# Patient Record
Sex: Female | Born: 1975 | Race: Black or African American | Hispanic: No | Marital: Married | Smoking: Former smoker
Health system: Southern US, Community
[De-identification: ages and names within clinical notes are randomized; demographics above are authoritative.]

## PROBLEM LIST (undated history)

## (undated) DIAGNOSIS — Z87442 Personal history of urinary calculi: Secondary | ICD-10-CM

## (undated) DIAGNOSIS — Z8709 Personal history of other diseases of the respiratory system: Secondary | ICD-10-CM

## (undated) DIAGNOSIS — J45909 Unspecified asthma, uncomplicated: Secondary | ICD-10-CM

## (undated) DIAGNOSIS — S60459A Superficial foreign body of unspecified finger, initial encounter: Secondary | ICD-10-CM

## (undated) HISTORY — PX: TUBAL LIGATION: SHX77

## (undated) HISTORY — PX: FOREIGN BODY REMOVAL: SHX962

## (undated) HISTORY — DX: Unspecified asthma, uncomplicated: J45.909

---

## 2013-06-18 ENCOUNTER — Emergency Department: Payer: Self-pay | Admitting: Emergency Medicine

## 2013-09-18 ENCOUNTER — Emergency Department: Payer: Self-pay | Admitting: Emergency Medicine

## 2014-07-18 DIAGNOSIS — S60459A Superficial foreign body of unspecified finger, initial encounter: Secondary | ICD-10-CM

## 2014-07-18 HISTORY — DX: Superficial foreign body of unspecified finger, initial encounter: S60.459A

## 2014-07-27 ENCOUNTER — Other Ambulatory Visit: Payer: Self-pay | Admitting: Orthopedic Surgery

## 2014-08-02 ENCOUNTER — Encounter (HOSPITAL_BASED_OUTPATIENT_CLINIC_OR_DEPARTMENT_OTHER): Payer: Self-pay | Admitting: *Deleted

## 2014-08-05 ENCOUNTER — Ambulatory Visit (HOSPITAL_BASED_OUTPATIENT_CLINIC_OR_DEPARTMENT_OTHER): Payer: Worker's Compensation | Admitting: Certified Registered"

## 2014-08-05 ENCOUNTER — Ambulatory Visit (HOSPITAL_BASED_OUTPATIENT_CLINIC_OR_DEPARTMENT_OTHER)
Admission: RE | Admit: 2014-08-05 | Discharge: 2014-08-05 | Disposition: A | Discharge status: Home / Self Care | Payer: Worker's Compensation | Source: Ambulatory Visit | Attending: Orthopedic Surgery | Admitting: Orthopedic Surgery

## 2014-08-05 ENCOUNTER — Encounter (HOSPITAL_BASED_OUTPATIENT_CLINIC_OR_DEPARTMENT_OTHER)
Admission: RE | Disposition: A | Discharge status: Home / Self Care | Payer: Self-pay | Source: Ambulatory Visit | Attending: Orthopedic Surgery

## 2014-08-05 ENCOUNTER — Encounter (HOSPITAL_BASED_OUTPATIENT_CLINIC_OR_DEPARTMENT_OTHER): Payer: Self-pay | Admitting: *Deleted

## 2014-08-05 DIAGNOSIS — Y929 Unspecified place or not applicable: Secondary | ICD-10-CM | POA: Diagnosis not present

## 2014-08-05 DIAGNOSIS — Z91018 Allergy to other foods: Secondary | ICD-10-CM | POA: Insufficient documentation

## 2014-08-05 DIAGNOSIS — F17210 Nicotine dependence, cigarettes, uncomplicated: Secondary | ICD-10-CM | POA: Diagnosis not present

## 2014-08-05 DIAGNOSIS — X58XXXA Exposure to other specified factors, initial encounter: Secondary | ICD-10-CM | POA: Insufficient documentation

## 2014-08-05 DIAGNOSIS — J45909 Unspecified asthma, uncomplicated: Secondary | ICD-10-CM | POA: Insufficient documentation

## 2014-08-05 DIAGNOSIS — Z87442 Personal history of urinary calculi: Secondary | ICD-10-CM | POA: Insufficient documentation

## 2014-08-05 DIAGNOSIS — Z91010 Allergy to peanuts: Secondary | ICD-10-CM | POA: Insufficient documentation

## 2014-08-05 DIAGNOSIS — Z91013 Allergy to seafood: Secondary | ICD-10-CM | POA: Insufficient documentation

## 2014-08-05 DIAGNOSIS — S60453A Superficial foreign body of left middle finger, initial encounter: Secondary | ICD-10-CM | POA: Diagnosis present

## 2014-08-05 HISTORY — PX: FOREIGN BODY REMOVAL: SHX962

## 2014-08-05 HISTORY — DX: Superficial foreign body of unspecified finger, initial encounter: S60.459A

## 2014-08-05 HISTORY — DX: Personal history of other diseases of the respiratory system: Z87.09

## 2014-08-05 HISTORY — DX: Personal history of urinary calculi: Z87.442

## 2014-08-05 LAB — POCT HEMOGLOBIN-HEMACUE: Hemoglobin: 12.5 g/dL (ref 12.0–15.0)

## 2014-08-05 SURGERY — REMOVAL FOREIGN BODY EXTREMITY
Anesthesia: General | Site: Finger | Laterality: Left

## 2014-08-05 MED ORDER — FENTANYL CITRATE 0.05 MG/ML IJ SOLN: 50.0000 ug | INTRAMUSCULAR | Status: DC | PRN

## 2014-08-05 MED ORDER — PROPOFOL 10 MG/ML IV BOLUS
INTRAVENOUS | Status: DC | PRN
  Administered 2014-08-05: 160 mg via INTRAVENOUS

## 2014-08-05 MED ORDER — ONDANSETRON HCL 4 MG/2ML IJ SOLN
INTRAMUSCULAR | Status: DC | PRN
  Administered 2014-08-05: 4 mg via INTRAVENOUS

## 2014-08-05 MED ORDER — CEFAZOLIN SODIUM-DEXTROSE 2-3 GM-% IV SOLR: 2.0000 g | INTRAVENOUS | Status: DC

## 2014-08-05 MED ORDER — DEXAMETHASONE SODIUM PHOSPHATE 10 MG/ML IJ SOLN
INTRAMUSCULAR | Status: DC | PRN
  Administered 2014-08-05: 10 mg via INTRAVENOUS

## 2014-08-05 MED ORDER — HYDROCODONE-ACETAMINOPHEN 5-325 MG PO TABS: 1.0000 | ORAL_TABLET | Freq: Once | ORAL | Status: DC | PRN

## 2014-08-05 MED ORDER — FENTANYL CITRATE 0.05 MG/ML IJ SOLN
INTRAMUSCULAR | Status: DC | PRN
  Administered 2014-08-05 (×2): 50 ug via INTRAVENOUS

## 2014-08-05 MED ORDER — HYDROMORPHONE HCL 1 MG/ML IJ SOLN
INTRAMUSCULAR | Status: AC
  Filled 2014-08-05: qty 1

## 2014-08-05 MED ORDER — LIDOCAINE HCL (CARDIAC) 20 MG/ML IV SOLN
INTRAVENOUS | Status: DC | PRN
  Administered 2014-08-05: 40 mg via INTRAVENOUS

## 2014-08-05 MED ORDER — CEFAZOLIN SODIUM-DEXTROSE 2-3 GM-% IV SOLR
INTRAVENOUS | Status: DC | PRN
  Administered 2014-08-05: 2 g via INTRAVENOUS

## 2014-08-05 MED ORDER — MIDAZOLAM HCL 2 MG/2ML IJ SOLN: 1.0000 mg | INTRAMUSCULAR | Status: DC | PRN

## 2014-08-05 MED ORDER — HYDROCODONE-ACETAMINOPHEN 5-325 MG PO TABS: 1.0000 | ORAL_TABLET | Freq: Four times a day (QID) | ORAL | Status: DC | PRN

## 2014-08-05 MED ORDER — MIDAZOLAM HCL 2 MG/ML PO SYRP: 12.0000 mg | ORAL_SOLUTION | Freq: Once | ORAL | Status: DC | PRN

## 2014-08-05 MED ORDER — CHLORHEXIDINE GLUCONATE 4 % EX LIQD: 60.0000 mL | Freq: Once | CUTANEOUS | Status: DC

## 2014-08-05 MED ORDER — HYDROCODONE-ACETAMINOPHEN 5-325 MG PO TABS
ORAL_TABLET | ORAL | Status: AC
  Filled 2014-08-05: qty 1

## 2014-08-05 MED ORDER — FENTANYL CITRATE 0.05 MG/ML IJ SOLN
INTRAMUSCULAR | Status: AC
  Filled 2014-08-05: qty 4

## 2014-08-05 MED ORDER — LACTATED RINGERS IV SOLN
INTRAVENOUS | Status: DC
  Administered 2014-08-05 (×2): via INTRAVENOUS

## 2014-08-05 MED ORDER — MIDAZOLAM HCL 5 MG/5ML IJ SOLN
INTRAMUSCULAR | Status: DC | PRN
  Administered 2014-08-05: 2 mg via INTRAVENOUS

## 2014-08-05 MED ORDER — OXYCODONE HCL 5 MG PO TABS: 5.0000 mg | ORAL_TABLET | Freq: Once | ORAL | Status: DC | PRN

## 2014-08-05 MED ORDER — BUPIVACAINE HCL (PF) 0.25 % IJ SOLN
INTRAMUSCULAR | Status: AC
  Filled 2014-08-05: qty 30

## 2014-08-05 MED ORDER — ONDANSETRON HCL 4 MG/2ML IJ SOLN: 4.0000 mg | Freq: Once | INTRAMUSCULAR | Status: DC | PRN

## 2014-08-05 MED ORDER — 0.9 % SODIUM CHLORIDE (POUR BTL) OPTIME
TOPICAL | Status: DC | PRN
  Administered 2014-08-05: 200 mL

## 2014-08-05 MED ORDER — MIDAZOLAM HCL 2 MG/2ML IJ SOLN
INTRAMUSCULAR | Status: AC
  Filled 2014-08-05: qty 2

## 2014-08-05 MED ORDER — HYDROMORPHONE HCL 1 MG/ML IJ SOLN
0.2500 mg | INTRAMUSCULAR | Status: DC | PRN
  Administered 2014-08-05 (×2): 0.5 mg via INTRAVENOUS

## 2014-08-05 MED ORDER — OXYCODONE HCL 5 MG/5ML PO SOLN: 5.0000 mg | Freq: Once | ORAL | Status: DC | PRN

## 2014-08-05 SURGICAL SUPPLY — 48 items
BLADE MINI RND TIP GREEN BEAV (BLADE) IMPLANT
BLADE SURG 15 STRL LF DISP TIS (BLADE) ×1 IMPLANT
BLADE SURG 15 STRL SS (BLADE) ×2
BNDG COHESIVE 1X5 TAN STRL LF (GAUZE/BANDAGES/DRESSINGS) ×3 IMPLANT
BNDG COHESIVE 3X5 TAN STRL LF (GAUZE/BANDAGES/DRESSINGS) ×3 IMPLANT
BNDG ESMARK 4X9 LF (GAUZE/BANDAGES/DRESSINGS) IMPLANT
BNDG GAUZE ELAST 4 BULKY (GAUZE/BANDAGES/DRESSINGS) IMPLANT
CHLORAPREP W/TINT 26ML (MISCELLANEOUS) ×3 IMPLANT
CORDS BIPOLAR (ELECTRODE) ×3 IMPLANT
COVER BACK TABLE 60X90IN (DRAPES) ×3 IMPLANT
COVER MAYO STAND STRL (DRAPES) ×3 IMPLANT
CUFF TOURNIQUET SINGLE 18IN (TOURNIQUET CUFF) ×3 IMPLANT
DECANTER SPIKE VIAL GLASS SM (MISCELLANEOUS) ×3 IMPLANT
DRAPE EXTREMITY T 121X128X90 (DRAPE) ×3 IMPLANT
DRAPE OEC MINIVIEW 54X84 (DRAPES) ×3 IMPLANT
DRAPE SURG 17X23 STRL (DRAPES) ×3 IMPLANT
DRSG KUZMA FLUFF (GAUZE/BANDAGES/DRESSINGS) IMPLANT
GAUZE SPONGE 4X4 12PLY STRL (GAUZE/BANDAGES/DRESSINGS) ×3 IMPLANT
GAUZE XEROFORM 1X8 LF (GAUZE/BANDAGES/DRESSINGS) ×3 IMPLANT
GLOVE BIO SURGEON STRL SZ7.5 (GLOVE) ×3 IMPLANT
GLOVE BIOGEL M STRL SZ7.5 (GLOVE) ×6 IMPLANT
GLOVE BIOGEL PI IND STRL 8 (GLOVE) ×1 IMPLANT
GLOVE BIOGEL PI IND STRL 8.5 (GLOVE) ×1 IMPLANT
GLOVE BIOGEL PI INDICATOR 8 (GLOVE) ×2
GLOVE BIOGEL PI INDICATOR 8.5 (GLOVE) ×2
GLOVE SURG ORTHO 8.0 STRL STRW (GLOVE) ×3 IMPLANT
GOWN STRL REUS W/ TWL LRG LVL3 (GOWN DISPOSABLE) IMPLANT
GOWN STRL REUS W/ TWL XL LVL3 (GOWN DISPOSABLE) ×1 IMPLANT
GOWN STRL REUS W/TWL LRG LVL3 (GOWN DISPOSABLE)
GOWN STRL REUS W/TWL XL LVL3 (GOWN DISPOSABLE) ×8 IMPLANT
NEEDLE HYPO 22GX1.5 SAFETY (NEEDLE) IMPLANT
NS IRRIG 1000ML POUR BTL (IV SOLUTION) ×3 IMPLANT
PACK BASIN DAY SURGERY FS (CUSTOM PROCEDURE TRAY) ×3 IMPLANT
PAD CAST 3X4 CTTN HI CHSV (CAST SUPPLIES) ×1 IMPLANT
PADDING CAST ABS 3INX4YD NS (CAST SUPPLIES)
PADDING CAST ABS 4INX4YD NS (CAST SUPPLIES)
PADDING CAST ABS COTTON 3X4 (CAST SUPPLIES) IMPLANT
PADDING CAST ABS COTTON 4X4 ST (CAST SUPPLIES) IMPLANT
PADDING CAST COTTON 3X4 STRL (CAST SUPPLIES) ×2
SPLINT PLASTER CAST XFAST 3X15 (CAST SUPPLIES) IMPLANT
SPLINT PLASTER XTRA FASTSET 3X (CAST SUPPLIES)
STOCKINETTE 4X48 STRL (DRAPES) ×3 IMPLANT
SUT ETHILON 5 0 P 3 18 (SUTURE)
SUT ETHILON 5 0 PS 2 18 (SUTURE) IMPLANT
SUT NYLON ETHILON 5-0 P-3 1X18 (SUTURE) IMPLANT
SYR CONTROL 10ML LL (SYRINGE) ×3 IMPLANT
TOWEL OR 17X24 6PK STRL BLUE (TOWEL DISPOSABLE) ×3 IMPLANT
UNDERPAD 30X30 INCONTINENT (UNDERPADS AND DIAPERS) ×3 IMPLANT

## 2014-08-05 NOTE — Anesthesia Procedure Notes (Signed)
Procedure Name: LMA Insertion Date/Time: 08/05/2014 3:12 PM Performed by: Maryella Shivers Pre-anesthesia Checklist: Patient identified, Emergency Drugs available, Suction available and Patient being monitored Patient Re-evaluated:Patient Re-evaluated prior to inductionOxygen Delivery Method: Circle System Utilized Preoxygenation: Pre-oxygenation with 100% oxygen Intubation Type: IV induction Ventilation: Mask ventilation without difficulty LMA: LMA inserted LMA Size: 4.0 Number of attempts: 1 Airway Equipment and Method: bite block Placement Confirmation: positive ETCO2 Tube secured with: Tape Dental Injury: Teeth and Oropharynx as per pre-operative assessment

## 2014-08-05 NOTE — Discharge Instructions (Addendum)

## 2014-08-05 NOTE — Brief Op Note (Signed)
08/05/2014  3:58 PM  PATIENT:  Joy Lewis  38 y.o. female  PRE-OPERATIVE DIAGNOSIS:  FOREIGN BODY LEFT MIDDLE FINGER  POST-OPERATIVE DIAGNOSIS:  FOREIGN BODY LEFT MIDDLE FINGER  PROCEDURE:  Procedure(s): REMOVAL FOREIGN BODY LEFT MIDDLE FINGER (Left)  SURGEON:  Surgeon(s) and Role:    * Daryll Brod, MD - Primary  PHYSICIAN ASSISTANT:   ASSISTANTS: R Dasnoit,PAC   ANESTHESIA:   general  EBL:  Total I/O In: 1000 [I.V.:1000] Out: -   BLOOD ADMINISTERED:none  DRAINS: none   LOCAL MEDICATIONS USED:  NONE  SPECIMEN:  No Specimen  DISPOSITION OF SPECIMEN:  N/A  COUNTS:  YES  TOURNIQUET:   Total Tourniquet Time Documented: Upper Arm (Left) - 26 minutes Total: Upper Arm (Left) - 26 minutes   DICTATION: .Other Dictation: Dictation Number [id number]  PLAN OF CARE: Discharge to home after PACU  PATIENT DISPOSITION:  PACU - hemodynamically stable.

## 2014-08-05 NOTE — Op Note (Signed)
Dictation Number [id number]

## 2014-08-05 NOTE — Op Note (Signed)
NAMEOlevia Lewis NO.:  [id number]  MEDICAL RECORD NO.:  [id number]  LOCATION:                                 FACILITY:  PHYSICIAN:  Daryll Brod, M.D.            DATE OF BIRTH:  DATE OF PROCEDURE:  08/05/2014 DATE OF DISCHARGE:                              OPERATIVE REPORT   PREOPERATIVE DIAGNOSIS:  Foreign body, left middle finger.  POSTOPERATIVE DIAGNOSIS:  Foreign body, left middle finger.  OPERATION:  Removal of deep foreign body, left middle finger.  SURGEON:  Daryll Brod, M.D.  ASSISTANT:  Julian Reil, PA-C  ANESTHESIA:  General.  ANESTHESIOLOGIST:  Glynda Jaeger, M.D.  HISTORY:  The patient is a 38 year old female who suffered a foreign body of her anterior left middle finger.  This has been partially removed through the ten piece.  This has been causing pain discomfort for her.  She is desirous having this removed.  She is well aware of risks and complications including infection; recurrence of injury to arteries, nerves, tendons; incomplete release of symptoms and dystrophy. In the preoperative area, the patient was seen, the extremity marked by both the patient and surgeon, and antibiotic given.  PROCEDURE IN DETAIL:  The patient was brought to the operating room where a general anesthetic was carried out without difficulty.  She was prepped using ChloraPrep, supine position with the left arm free.  A 3- minute dry time was allowed, time-out taken, confirming the patient and procedure.  The area was confirmed on image intensification using a needle to position over the foreign body.  An incision was made obliquely in nature, carried down through the subcutaneous tissue. Neurovascular structures were identified.  The foreign body was found to be lodged in the flexor sheath along the radial margin of the middle phalanx.  This was done with some difficulty using image intensification guidance to ultimately deep this foreign body.   This was encased in granulation tissue, which was removed.  The wound was copiously irrigated with saline.  The foreign body was given to the patient.  The patient tolerated the procedure well.  The wound closed with interrupted 4-0 Vicryl Rapide sutures.  Sterile compressive dressing applied.  On deflation of the tourniquet, all fingers were immediately pinked.  She was taken to the recovery room for observation in satisfactory condition.  She will be discharged to home to return to the [facility] in 1 week, on Vicodin.          ______________________________ Daryll Brod, M.D.     GK/MEDQ  D:  08/05/2014  T:  08/05/2014  Job:  [id number]

## 2014-08-05 NOTE — Transfer of Care (Signed)
Immediate Anesthesia Transfer of Care Note  Patient: Joy Lewis  Procedure(s) Performed: Procedure(s): REMOVAL FOREIGN BODY LEFT MIDDLE FINGER (Left)  Patient Location: PACU  Anesthesia Type:General  Level of Consciousness: sedated  Airway & Oxygen Therapy: Patient Spontanous Breathing and Patient connected to face mask oxygen  Post-op Assessment: Report given to PACU RN and Post -op Vital signs reviewed and stable  Post vital signs: Reviewed and stable  Complications: No apparent anesthesia complications

## 2014-08-05 NOTE — Anesthesia Postprocedure Evaluation (Signed)
  Anesthesia Post-op Note  Patient: Joy Lewis  Procedure(s) Performed: Procedure(s) with comments: REMOVAL FOREIGN BODY LEFT MIDDLE FINGER (Left) - FOREIGN BODY GIVEN TO PATIENT PER DR. Fredna Dow ORDER.  Patient Location: PACU  Anesthesia Type:General  Level of Consciousness: awake, alert  and oriented  Airway and Oxygen Therapy: Patient Spontanous Breathing  Post-op Pain: mild  Post-op Assessment: Post-op Vital signs reviewed, Patient's Cardiovascular Status Stable, Respiratory Function Stable, Patent Airway, No signs of Nausea or vomiting and Pain level controlled  Post-op Vital Signs: stable  Last Vitals:  Filed Vitals:   08/05/14 1615  BP: 126/62  Pulse: 73  Temp:   Resp: 17    Complications: No apparent anesthesia complications

## 2014-08-05 NOTE — Anesthesia Preprocedure Evaluation (Signed)
Anesthesia Evaluation  Patient identified by MRN, date of birth, ID band Patient awake    Reviewed: Allergy & Precautions, H&P , NPO status , Patient's Chart, lab work & pertinent test results  Airway Mallampati: I  TM Distance: >3 FB Neck ROM: Full    Dental  (+) Teeth Intact, Dental Advisory Given   Pulmonary Current Smoker,  breath sounds clear to auscultation        Cardiovascular Rhythm:Regular Rate:Normal     Neuro/Psych    GI/Hepatic   Endo/Other    Renal/GU      Musculoskeletal   Abdominal   Peds  Hematology   Anesthesia Other Findings   Reproductive/Obstetrics                             Anesthesia Physical Anesthesia Plan  ASA: II  Anesthesia Plan: General   Post-op Pain Management:    Induction: Intravenous  Airway Management Planned: LMA  Additional Equipment:   Intra-op Plan:   Post-operative Plan:   Informed Consent: I have reviewed the patients History and Physical, chart, labs and discussed the procedure including the risks, benefits and alternatives for the proposed anesthesia with the patient or authorized representative who has indicated his/her understanding and acceptance.   Dental advisory given  Plan Discussed with: CRNA and Anesthesiologist  Anesthesia Plan Comments: (FB LMF [place]  Plan GA with LMA  Roberts Gaudy)        Anesthesia Quick Evaluation

## 2014-08-05 NOTE — H&P (Signed)
Joy Lewis is a 38 year old right hand dominant female who comes in complaining of having an injury to her left middle finger when an embroidery needle went through her left middle finger at the middle phalanx level. The injury occurred on 12-09-12. This was in [place], [place]. She states that little was done for 2 months. She was seen at the hospital where attempt at removal was performed. This was unsuccessful. She subsequently had surgery in May 2014 apparently by Dr. Doris Cheadle who removed it. Apparently a portion was left. She was out of work for one week and then returned. She states that she did not have any therapy following this. She continues to complain of pain. She has no prior history of injury. No history of diabetes, thyroid problems, arthritis or gout. She has not settled her case. She states she got stuck with the needle working at an [employer] came down on her with it having been delayed and punched the middle finger leaving a piece of metal there. She complains of intermittent, moderate, sharp, stabbing, throbbing and aching type pain with a feeling of numbness and weakness over the radial aspect left middle finger. She is complaining of some pain going up her hand both dorsally and palmarly. She has been taking ibuprofen occasionally for discomfort.  PAST MEDICAL HISTORY: She has no known drug allergies. She takes no medicines. She has had a tubal ligation and surgery on her hand.  FAMILY H ISTORY: Positive for diabetes, arthritis and high BP.  SOCIAL HISTORY: She does not smoke. She drinks occasionally. 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  REVIEW OF SYSTEMS: Positive for asthma, nervousness, otherwise negative for 14 points. Joy Lewis is an 38 y.o. female.   Chief Complaint: foreign body left middle finger HPI: see above  Past Medical History  Diagnosis Date  . Foreign body of finger of left hand 07/2014    left middle finger  .  History of kidney stones   . History of asthma     as a child    Past Surgical History  Procedure Laterality Date  . Tubal ligation    . Foreign body removal Left     middle finger    History reviewed. No pertinent family history. Social History:  reports that she has been smoking Cigarettes.  She has been smoking about 0.00 packs per day for the past 13 years. She has never used smokeless tobacco. She reports that she drinks alcohol. She reports that she does not use illicit drugs.  Allergies:  Allergies  Allergen Reactions  . Peanut-Containing Drug Products Hives and Swelling  . Shellfish Allergy Hives and Swelling  . Tomato Hives and Swelling    Medications Prior to Admission  Medication Sig Dispense Refill  . ibuprofen (ADVIL,MOTRIN) 200 MG tablet Take 200 mg by mouth every 6 (six) hours as needed.      No results found for this or any previous visit (from the past 48 hour(s)).  No results found.   Pertinent items are noted in HPI.  Blood pressure 106/76, pulse 64, temperature 98.8 F (37.1 C), temperature source Oral, resp. rate 18, height 5' (1.524 m), weight 58.627 kg (129 lb 4 oz), last menstrual period 08/02/2014, SpO2 100 %.  General appearance: alert, cooperative and appears stated age Head: Normocephalic, without obvious abnormality Neck: no JVD Resp: clear to auscultation bilaterally Cardio: regular rate and rhythm, S1, S2 normal, no murmur, click, rub or gallop  GI: soft, non-tender; bowel sounds normal; no masses,  no organomegaly Extremities: foreign body left middle finger Pulses: 2+ and symmetric Skin: Skin color, texture, turgor normal. No rashes or lesions Neurologic: Grossly normal Incision/Wound: na  Assessment/Plan X-rays reveal there is a small fragment left. It is difficult to determine exactly where this is on the radial aspect mid portion of the middle phalanx slightly volar.  She has had her MRI done and this reveals that the entire area  was wiped out by the foreign body being unable to tell exactly where it was but it is present. Flexion/extension reveals that it does not move with the tendons, as such it is likely in the soft tissues directly adjacent to it in the flexor sheath. She would like to have this removed. We have discussed the possibility of excision of the foreign body from her left middle finger. Pre, peri and post op care are discussed along with risks and complications. Patient is aware there is no guarantee with surgery, possibility of infection, injury to arteries, nerves, and tendons, incomplete relief and dystrophy. She is scheduled for excision foreign body left middle finger as an outpatient under regional anesthesia.  Joy Lewis R 08/05/2014, 12:58 PM

## 2014-08-06 ENCOUNTER — Encounter (HOSPITAL_BASED_OUTPATIENT_CLINIC_OR_DEPARTMENT_OTHER): Payer: Self-pay | Admitting: Orthopedic Surgery

## 2015-08-10 ENCOUNTER — Emergency Department
Admission: EM | Admit: 2015-08-10 | Discharge: 2015-08-10 | Disposition: A | Discharge status: Home / Self Care | Payer: Self-pay | Attending: Emergency Medicine | Admitting: Emergency Medicine

## 2015-08-10 DIAGNOSIS — K122 Cellulitis and abscess of mouth: Secondary | ICD-10-CM | POA: Insufficient documentation

## 2015-08-10 DIAGNOSIS — M272 Inflammatory conditions of jaws: Secondary | ICD-10-CM

## 2015-08-10 DIAGNOSIS — F17210 Nicotine dependence, cigarettes, uncomplicated: Secondary | ICD-10-CM | POA: Insufficient documentation

## 2015-08-10 MED ORDER — AMOXICILLIN-POT CLAVULANATE 875-125 MG PO TABS: 1.0000 | ORAL_TABLET | Freq: Two times a day (BID) | ORAL | Status: DC

## 2015-08-10 MED ORDER — MAGIC MOUTHWASH W/LIDOCAINE: 5.0000 mL | Freq: Four times a day (QID) | ORAL | Status: DC

## 2015-08-10 MED ORDER — LIDOCAINE-EPINEPHRINE (PF) 1 %-1:200000 IJ SOLN
INTRAMUSCULAR | Status: AC
  Filled 2015-08-10: qty 30

## 2015-08-10 MED ORDER — OXYCODONE-ACETAMINOPHEN 5-325 MG PO TABS: 1.0000 | ORAL_TABLET | Freq: Four times a day (QID) | ORAL | Status: DC | PRN

## 2015-08-10 NOTE — ED Provider Notes (Signed)
INCISION AND DRAINAGE Performed by: Lenise Arena E Consent: Verbal consent obtained. Risks and benefits: risks, benefits and alternatives were discussed Type: abscess  Body area: Hard palate  Anesthesia: local infiltration  Incision was made with a scalpel.  Local anesthetic: lidocaine 1 % with epinephrine  Anesthetic total: 3 ml  Complexity: Simple Blunt dissection to break up loculations  Drainage: purulent  Drainage amount: 2-3 ML's   Patient tolerance: Patient tolerated the procedure well with no immediate complications. ------------------------------------------------------------------------------------------------------------------------ Patient underwent incision and drainage as noted above, she tolerated this well with no cough medication. Pressure was applied to the referring mouth with gauze. She'll be placed on antibiotics and will have close follow-up.   Medical screening examination/treatment/procedure(s) were performed by non-physician practitioner and as supervising physician I was immediately available for consultation/collaboration.       Earleen Newport, MD 08/10/15 [phone]

## 2015-08-10 NOTE — ED Notes (Signed)
Pt c/o abscess to the roof of the mouth for the past 6 days.

## 2015-08-10 NOTE — ED Provider Notes (Signed)
[facility] Emergency Department Provider Note  ____________________________________________  Time seen: Approximately 6:16 PM  I have reviewed the triage vital signs and the nursing notes.   HISTORY  Chief Complaint Abscess    HPI Joy Lewis is a 39 y.o. female who presents to emergency department with a complaint of a abscess to the roof of her mouth. She states that symptoms began initially a week ago and had increased. She states initially she noticed a swollen and tender area around the dentition in the roof of her mouth. She is tried multiple home remedies including over-the-counter medications as well as her most recent hematocrit control. She reports that the swelling, pain, symptoms have increased progressively. At this point patient is not talking due to pain and swelling. She denies any shortness of breath or difficulty swallowing. She endorses low-grade fevers. Pain is sharp, constant, worse with talking or eating.   Past Medical History  Diagnosis Date  . Foreign body of finger of left hand 07/2014    left middle finger  . History of kidney stones   . History of asthma     as a child    There are no active problems to display for this patient.   Past Surgical History  Procedure Laterality Date  . Tubal ligation    . Foreign body removal Left     middle finger  . Foreign body removal Left 08/05/2014    Procedure: REMOVAL FOREIGN BODY LEFT MIDDLE FINGER;  Surgeon: Daryll Brod, MD;  Location: [facility];  Service: Orthopedics;  Laterality: Left;  FOREIGN BODY GIVEN TO PATIENT PER DR. Fredna Dow ORDER.    Current Outpatient Rx  Name  Route  Sig  Dispense  Refill  . amoxicillin-clavulanate (AUGMENTIN) 875-125 MG tablet   Oral   Take 1 tablet by mouth 2 (two) times daily.   14 tablet   0   . HYDROcodone-acetaminophen (NORCO) 5-325 MG per tablet   Oral   Take 1 tablet by mouth every 6 (six) hours as needed for moderate  pain.   30 tablet   0   . ibuprofen (ADVIL,MOTRIN) 200 MG tablet   Oral   Take 200 mg by mouth every 6 (six) hours as needed.         . magic mouthwash w/lidocaine SOLN   Oral   Take 5 mLs by mouth 4 (four) times daily.   240 mL   0   . oxyCODONE-acetaminophen (ROXICET) 5-325 MG tablet   Oral   Take 1 tablet by mouth every 6 (six) hours as needed for severe pain.   20 tablet   0     Allergies Peanut-containing drug products; Shellfish allergy; and Tomato  No family history on file.  Social History Social History  Substance Use Topics  . Smoking status: Current Some Day Smoker -- 13 years    Types: Cigarettes  . Smokeless tobacco: Never Used     Comment: 3 cig./week  . Alcohol Use: Yes     Comment: occasionally    Review of Systems Constitutional: No fever/chills Eyes: No visual changes. ENT: No sore throat. Endorses abscess to remove mouth. Cardiovascular: Denies chest pain. Respiratory: Denies shortness of breath. Gastrointestinal: No abdominal pain.  No nausea, no vomiting.  No diarrhea.  No constipation. Genitourinary: Negative for dysuria. Musculoskeletal: Negative for back pain. Skin: Negative for rash. Neurological: Negative for headaches, focal weakness or numbness.  10-point ROS otherwise negative.  ____________________________________________   PHYSICAL EXAM:  VITAL SIGNS: ED Triage Vitals  Enc Vitals Group     BP 08/10/15 1808 114/79 mmHg     Pulse Rate 08/10/15 1808 112     Resp 08/10/15 1808 18     Temp 08/10/15 1808 99.8 F (37.7 C)     Temp Source 08/10/15 1808 Oral     SpO2 08/10/15 1808 98 %     Weight 08/10/15 1809 115 lb (52.164 kg)     Height 08/10/15 1809 5\' 3"  (1.6 m)     Head Cir --      Peak Flow --      Pain Score 08/10/15 1809 10     Pain Loc --      Pain Edu? --      Excl. in [place]? --     Constitutional: Alert and oriented. Well appearing and in no acute distress. Eyes: Conjunctivae are normal. PERRL. EOMI. Head:  Atraumatic. Nose: No congestion/rhinnorhea. Mouth/Throat: Mucous membranes are moist.  Oropharynx non-erythematous. Large erythematous and edematous fluctuant lesion noted to the right side hard palate. No drainage noted. Lesion extends from front incisors to back molars. No swelling of the oropharynx at this time. Neck: No stridor.   Hematological/Lymphatic/Immunilogical: No cervical lymphadenopathy. Cardiovascular: Normal rate, regular rhythm. Grossly normal heart sounds.  Good peripheral circulation. Respiratory: Normal respiratory effort.  No retractions. Lungs CTAB. Gastrointestinal: Soft and nontender. No distention. No abdominal bruits. No CVA tenderness. Musculoskeletal: No lower extremity tenderness nor edema.  No joint effusions. Neurologic:  Normal speech and language. No gross focal neurologic deficits are appreciated. No gait instability. Skin:  Skin is warm, dry and intact. No rash noted. Psychiatric: Mood and affect are normal. Speech and behavior are normal.  ____________________________________________   LABS (all labs ordered are listed, but only abnormal results are displayed)  Labs Reviewed - No data to display ____________________________________________  EKG   ____________________________________________  RADIOLOGY   ____________________________________________   PROCEDURES  Procedure(s) performed: Yes, incision and drainage, see procedure note(s).   INCISION AND DRAINAGE Performed by: Charline Bills Cuthriell and Dr. Lenise Arena See separate note by Dr. Jimmye Norman.      Critical Care performed: No  ____________________________________________   INITIAL IMPRESSION / ASSESSMENT AND PLAN / ED COURSE  Pertinent labs & imaging results that were available during my care of the patient were reviewed by me and considered in my medical decision making (see chart for details).  Patient's history, symptoms, physical exam are consistent with a abscess  to the roof of the mouth on the right side. After initial assessment I discussed findings with Dr. Jimmye Norman and he proceeded to evaluate the patient as well. Incision and drainage was performed by Dr. Jimmye Norman with myself assisting. 2-3 ML's of frank pus was expressed. Patient tolerated procedure well. The patient will be sent home with antibiotics and pain medication for same. Advised patient to practice good oral hygiene as well as follow-up with dentist. The patient was reevaluated after incision and drainage with no continued bleeding. Patient verbalizes improvement to symptoms. Patient will be discharged home with strict ED precautions to return for any increase in swelling, fevers, difficulty breathing or swallowing. Patient verbalizes understanding of the treatment plan and verbalizes compliance with same. ____________________________________________   FINAL CLINICAL IMPRESSION(S) / ED DIAGNOSES  Final diagnoses:  Hard palate abscess      Darletta Moll, PA-C 08/10/15 1900  Earleen Newport, MD 08/10/15 2039

## 2015-08-10 NOTE — ED Notes (Signed)
Pt presents with swelling noted to right upper gum and palate. Pt unable to speak. Pt husband reports pt has tried multiple home remedies and has been unable to eat and is having difficulty sleeping due to the pain.

## 2015-08-10 NOTE — Discharge Instructions (Signed)

## 2016-07-23 ENCOUNTER — Emergency Department: Payer: Self-pay

## 2016-07-23 ENCOUNTER — Emergency Department
Admission: EM | Admit: 2016-07-23 | Discharge: 2016-07-23 | Disposition: A | Discharge status: Home / Self Care | Payer: Self-pay | Attending: Emergency Medicine | Admitting: Emergency Medicine

## 2016-07-23 DIAGNOSIS — N23 Unspecified renal colic: Secondary | ICD-10-CM | POA: Insufficient documentation

## 2016-07-23 DIAGNOSIS — J45909 Unspecified asthma, uncomplicated: Secondary | ICD-10-CM | POA: Insufficient documentation

## 2016-07-23 DIAGNOSIS — Z791 Long term (current) use of non-steroidal anti-inflammatories (NSAID): Secondary | ICD-10-CM | POA: Insufficient documentation

## 2016-07-23 DIAGNOSIS — F17210 Nicotine dependence, cigarettes, uncomplicated: Secondary | ICD-10-CM | POA: Insufficient documentation

## 2016-07-23 LAB — URINALYSIS COMPLETE WITH MICROSCOPIC (ARMC ONLY)
BACTERIA UA: NONE SEEN
Bilirubin Urine: NEGATIVE
Glucose, UA: NEGATIVE mg/dL
Ketones, ur: NEGATIVE mg/dL
NITRITE: NEGATIVE
PROTEIN: 30 mg/dL — AB
SPECIFIC GRAVITY, URINE: 1.019 (ref 1.005–1.030)
pH: 8 (ref 5.0–8.0)

## 2016-07-23 LAB — COMPREHENSIVE METABOLIC PANEL
ALT: 13 U/L — ABNORMAL LOW (ref 14–54)
AST: 20 U/L (ref 15–41)
Albumin: 3.9 g/dL (ref 3.5–5.0)
Alkaline Phosphatase: 58 U/L (ref 38–126)
Anion gap: 7 (ref 5–15)
BUN: 14 mg/dL (ref 6–20)
CO2: 25 mmol/L (ref 22–32)
Calcium: 9 mg/dL (ref 8.9–10.3)
Chloride: 106 mmol/L (ref 101–111)
Creatinine, Ser: 0.82 mg/dL (ref 0.44–1.00)
GFR calc Af Amer: >60 mL/min (ref >60)
GFR calc non Af Amer: >60 mL/min (ref >60)
Glucose, Bld: 120 mg/dL — ABNORMAL HIGH (ref 65–99)
Potassium: 4 mmol/L (ref 3.5–5.1)
Sodium: 138 mmol/L (ref 135–145)
Total Bilirubin: 0.1 mg/dL — ABNORMAL LOW (ref 0.3–1.2)
Total Protein: 7.2 g/dL (ref 6.5–8.1)

## 2016-07-23 LAB — CBC
HEMATOCRIT: 39.4 % (ref 35.0–47.0)
HEMOGLOBIN: 13.5 g/dL (ref 12.0–16.0)
MCH: 34.8 pg — AB (ref 26.0–34.0)
MCHC: 34.4 g/dL (ref 32.0–36.0)
MCV: 101.3 fL — ABNORMAL HIGH (ref 80.0–100.0)
Platelets: 239 10*3/uL (ref 150–440)
RBC: 3.89 MIL/uL (ref 3.80–5.20)
RDW: 14.2 % (ref 11.5–14.5)
WBC: 9.6 10*3/uL (ref 3.6–11.0)

## 2016-07-23 LAB — LIPASE, BLOOD: Lipase: 21 U/L (ref 11–51)

## 2016-07-23 MED ORDER — ONDANSETRON 4 MG PO TBDP: 4.0000 mg | ORAL_TABLET | Freq: Three times a day (TID) | ORAL | 0 refills | Status: DC | PRN

## 2016-07-23 MED ORDER — OXYCODONE-ACETAMINOPHEN 7.5-325 MG PO TABS: 1.0000 | ORAL_TABLET | ORAL | 0 refills | Status: AC | PRN

## 2016-07-23 MED ORDER — HYDROMORPHONE HCL 1 MG/ML IJ SOLN
0.5000 mg | Freq: Once | INTRAMUSCULAR | Status: AC
  Administered 2016-07-23: 0.5 mg via INTRAVENOUS

## 2016-07-23 MED ORDER — HYDROMORPHONE HCL 1 MG/ML IJ SOLN
INTRAMUSCULAR | Status: AC
  Administered 2016-07-23: 0.5 mg via INTRAVENOUS
  Filled 2016-07-23: qty 1

## 2016-07-23 MED ORDER — CEPHALEXIN 500 MG PO CAPS: 500.0000 mg | ORAL_CAPSULE | Freq: Four times a day (QID) | ORAL | 0 refills | Status: AC

## 2016-07-23 MED ORDER — ONDANSETRON HCL 4 MG/2ML IJ SOLN
4.0000 mg | Freq: Once | INTRAMUSCULAR | Status: AC | PRN
  Administered 2016-07-23: 4 mg via INTRAVENOUS
  Filled 2016-07-23: qty 2

## 2016-07-23 MED ORDER — TAMSULOSIN HCL 0.4 MG PO CAPS: 0.4000 mg | ORAL_CAPSULE | Freq: Every day | ORAL | 0 refills | Status: DC

## 2016-07-23 MED ORDER — KETOROLAC TROMETHAMINE 30 MG/ML IJ SOLN
30.0000 mg | Freq: Once | INTRAMUSCULAR | Status: AC
  Administered 2016-07-23: 30 mg via INTRAVENOUS
  Filled 2016-07-23: qty 1

## 2016-07-23 NOTE — ED Notes (Signed)
Pt resting in bed, pt denies any needs at this time.

## 2016-07-23 NOTE — ED Provider Notes (Signed)
[facility] Emergency Department Provider Note        Time seen: ----------------------------------------- 8:34 AM on 07/23/2016 -----------------------------------------    I have reviewed the triage vital signs and the nursing notes.   HISTORY  Chief Complaint Abdominal Pain    HPI Glynn Laatsch is a 40 y.o. female who presents to the ER for right lower quadrant pain that radiates around her back with nausea and vomiting since early this morning. Patient reports a history of kidney stones in the past, is not sure when her last one was. She denies fevers or chills, has had some nausea but no vomiting. Patient states the pain is sharp.   Past Medical History:  Diagnosis Date  . Foreign body of finger of left hand 07/2014   left middle finger  . History of asthma    as a child  . History of kidney stones     There are no active problems to display for this patient.   Past Surgical History:  Procedure Laterality Date  . FOREIGN BODY REMOVAL Left    middle finger  . FOREIGN BODY REMOVAL Left 08/05/2014   Procedure: REMOVAL FOREIGN BODY LEFT MIDDLE FINGER;  Surgeon: Daryll Brod, MD;  Location: [place];  Service: Orthopedics;  Laterality: Left;  FOREIGN BODY GIVEN TO PATIENT PER DR. Fredna Dow ORDER.  . TUBAL LIGATION      Allergies Peanut-containing drug products; Shellfish allergy; Tomato; and Penicillins  Social History Social History  Substance Use Topics  . Smoking status: Current Some Day Smoker    Years: 13.00    Types: Cigarettes  . Smokeless tobacco: Never Used     Comment: 3 cig./week  . Alcohol use Yes     Comment: occasionally    Review of Systems Constitutional: Negative for fever. Cardiovascular: Negative for chest pain. Respiratory: Negative for shortness of breath. Gastrointestinal: Positive for abdominal pain, nausea Genitourinary: Negative for dysuria. Musculoskeletal: Negative for back pain. Skin:  Negative for rash. Neurological: Negative for headaches, focal weakness or numbness.  10-point ROS otherwise negative.  ____________________________________________   PHYSICAL EXAM:  VITAL SIGNS: ED Triage Vitals  Enc Vitals Group     BP 07/23/16 0809 (!) 120/49     Pulse Rate 07/23/16 0809 74     Resp 07/23/16 0809 20     Temp 07/23/16 0809 97.9 F (36.6 C)     Temp Source 07/23/16 0809 Oral     SpO2 07/23/16 0809 100 %     Weight 07/23/16 0809 135 lb (61.2 kg)     Height 07/23/16 0809 5' (1.524 m)     Head Circumference --      Peak Flow --      Pain Score 07/23/16 0810 8     Pain Loc --      Pain Edu? --      Excl. in [place]? --     Constitutional: Alert and oriented. Well appearing and in no distress. Eyes: Conjunctivae are normal. PERRL. Normal extraocular movements. ENT   Head: Normocephalic and atraumatic.   Nose: No congestion/rhinnorhea.   Mouth/Throat: Mucous membranes are moist.   Neck: No stridor. Cardiovascular: Normal rate, regular rhythm. No murmurs, rubs, or gallops. Respiratory: Normal respiratory effort without tachypnea nor retractions. Breath sounds are clear and equal bilaterally. No wheezes/rales/rhonchi. Gastrointestinal: Right flank tenderness, no rebound or guarding. Normal bowel sounds. Musculoskeletal: Nontender with normal range of motion in all extremities. No lower extremity tenderness nor edema. Neurologic:  Normal speech  and language. No gross focal neurologic deficits are appreciated.  Skin:  Skin is warm, dry and intact. No rash noted. Psychiatric: Mood and affect are normal. Speech and behavior are normal.  ____________________________________________  EKG: Interpreted by me. Sinus rhythm with a rate of 61 bpm, normal PR interval, normal QRS, normal QT, normal axis.  ____________________________________________  ED COURSE:  Pertinent labs & imaging results that were available during my care of the patient were reviewed by  me and considered in my medical decision making (see chart for details). Clinical Course   Patient presents for flank pain, likely renal colic. We will assess with labs and imaging.  Procedures ____________________________________________   LABS (pertinent positives/negatives)  Labs Reviewed  COMPREHENSIVE METABOLIC PANEL - Abnormal; Notable for the following:       Result Value   Glucose, Bld 120 (*)    ALT 13 (*)    Total Bilirubin 0.1 (*)    All other components within normal limits  CBC - Abnormal; Notable for the following:    MCV 101.3 (*)    MCH 34.8 (*)    All other components within normal limits  LIPASE, BLOOD  URINALYSIS COMPLETEWITH MICROSCOPIC ([facility] ONLY)    RADIOLOGY Images were viewed by me  CT renal protocol IMPRESSION: 1. Bilateral nephrolithiasis with mild to moderate right hydroureteronephrosis secondary to a distal right ureteral stone. Mild fullness left intrarenal collecting system with probable proximal left ureteral stone identified. 2. Small volume intraperitoneal free fluid, may be physiologic in a reproductive age female. 3. Bilateral Petit's hernias, left greater than right, containing only fat. ____________________________________________  FINAL ASSESSMENT AND PLAN  Flank pain, Bilateral Renal colic  Plan: Patient with labs and imaging as dictated above. Patient with flank pain and abdominal pain secondary to bilateral renal colic. I have discussed with urology who will arrange close outpatient follow-up. She'll be discharged with pain medication, Flomax and is advised to return for worsening or worrisome symptoms.   Earleen Newport, MD   Note: This dictation was prepared with Dragon dictation. Any transcriptional errors that result from this process are unintentional    Earleen Newport, MD 07/23/16 1226

## 2016-07-23 NOTE — ED Notes (Signed)
Pt resting in phone, talking on phone, pt awake and alert in no acute distress

## 2016-07-23 NOTE — ED Notes (Signed)
Pt discharged home after verbalizing understanding of discharge instructions; nad noted. 

## 2016-07-23 NOTE — ED Triage Notes (Signed)
Pt comes into the ED via EMS from home with c/o RLQ pain that radiates around to the back with N/V since early this morning.Marland Kitchen

## 2017-03-20 IMAGING — CT CT RENAL STONE PROTOCOL
2 of 4 series · 15 of 46 positions shown, 17 images · IV contrast (eovist)
Comparison: None.

ADDENDUM:
As described in the body of the report, the patient has two
ill-defined hepatic lesions, not suggestive of cysts. These may
represent cavernous hemangioma, but follow-up nonemergent MRI of the
abdomen without and with contrast is recommended to further
evaluate. Use of Eovist contrast material is recommended.

These results will be called to the ordering clinician or
representative by the Radiologist Assistant, and communication
documented in the PACS or zVision Dashboard.
CLINICAL DATA: Right lower quadrant pain radiates to the back with
nausea and vomiting since 4 a.m. this morning.
EXAM:
CT ABDOMEN AND PELVIS WITHOUT CONTRAST
TECHNIQUE: Multidetector CT imaging of the abdomen and pelvis was performed
following the standard protocol without IV contrast.

[Series 2: axial st · axial · 0.59mm/px · z∈[-1195,-845]mm · 12 of 81 slices shown, 14 images]
[im 7/81  soft-tissue]
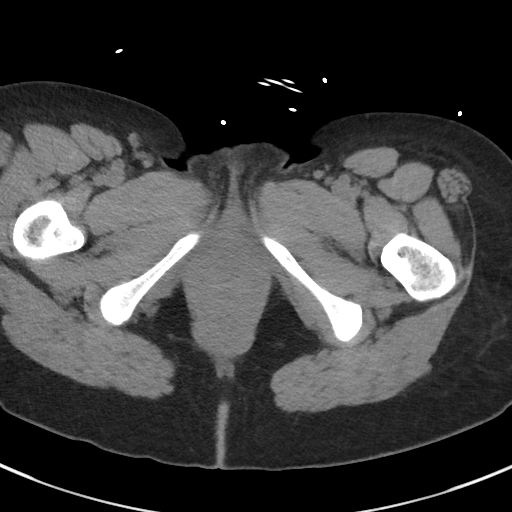
[im 7/81  bone]
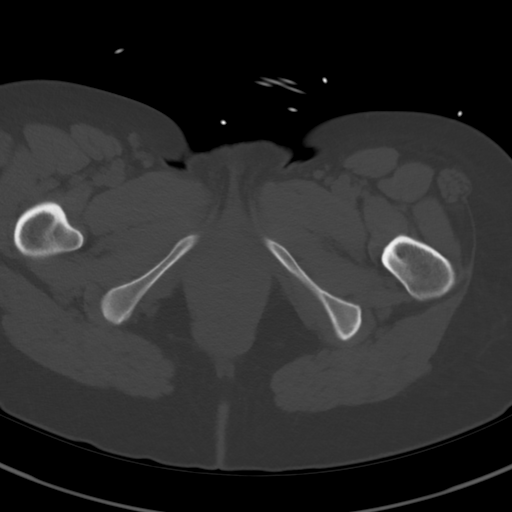
[im 13/81  soft-tissue]
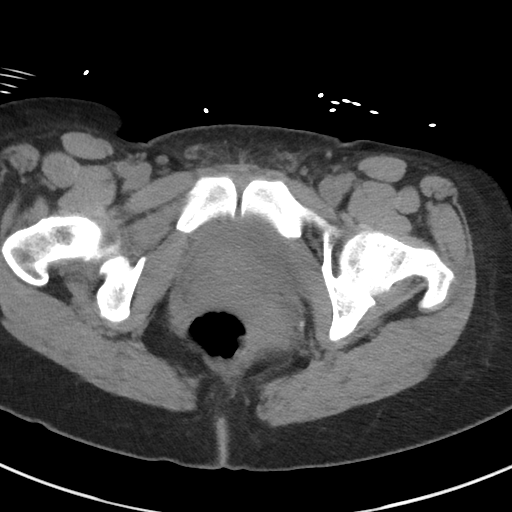
[im 20/81  soft-tissue]
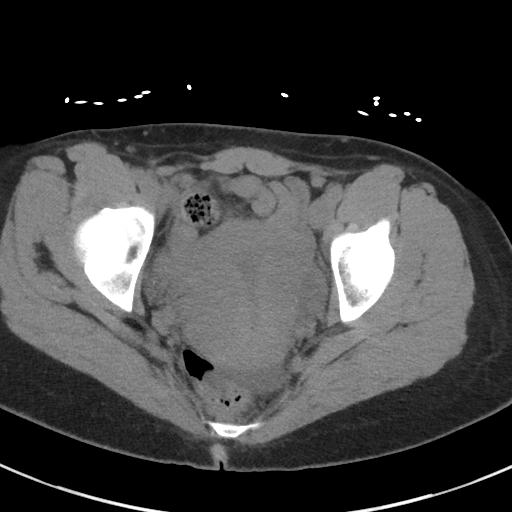
[im 26/81  soft-tissue]
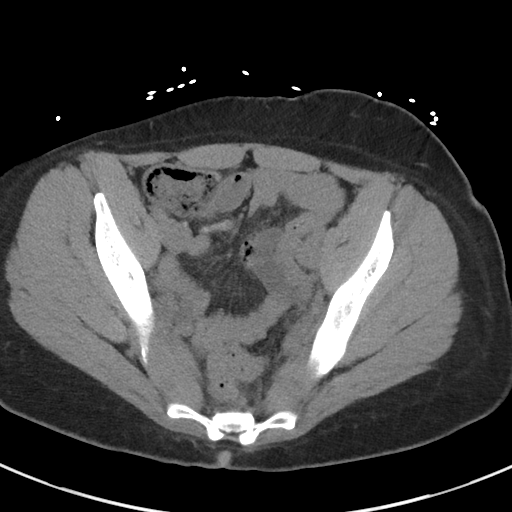
[im 33/81  soft-tissue]
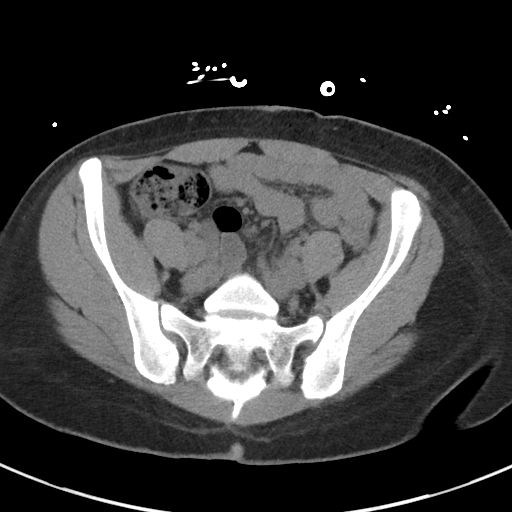
[im 39/81  soft-tissue]
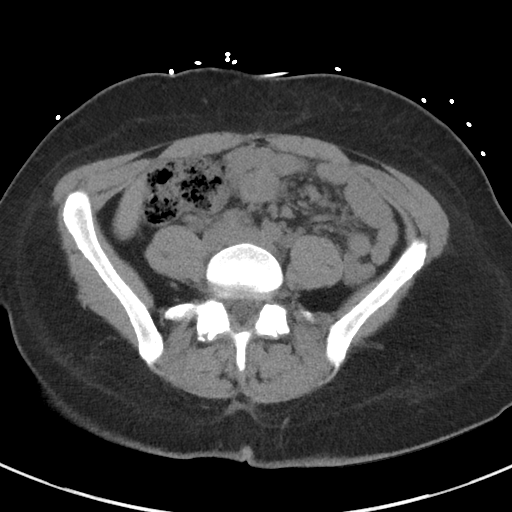
[im 45/81  soft-tissue]
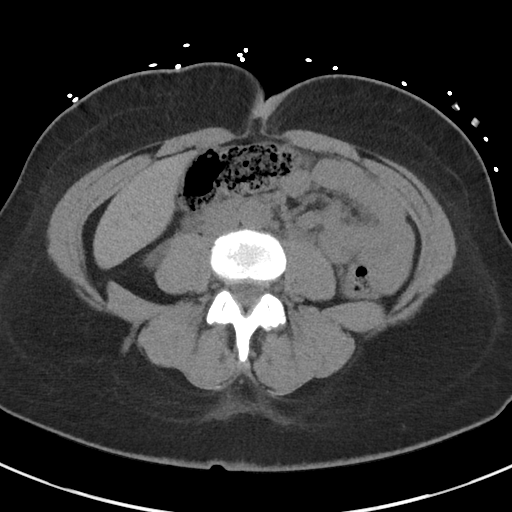
[im 52/81  soft-tissue]
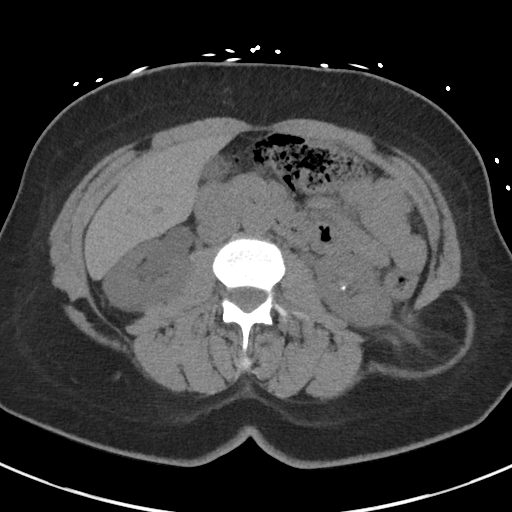
[im 58/81  soft-tissue]
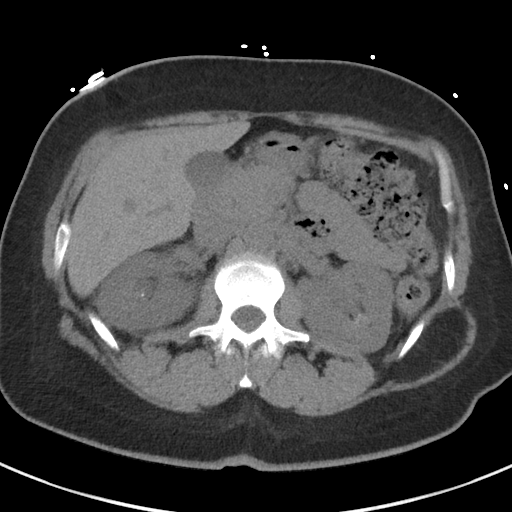
[im 58/81  bone]
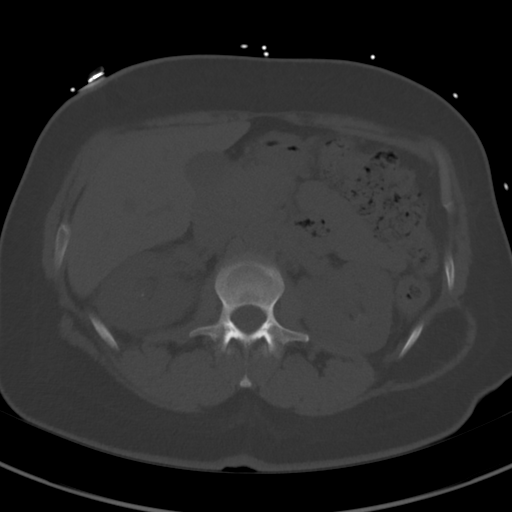
[im 65/81  soft-tissue]
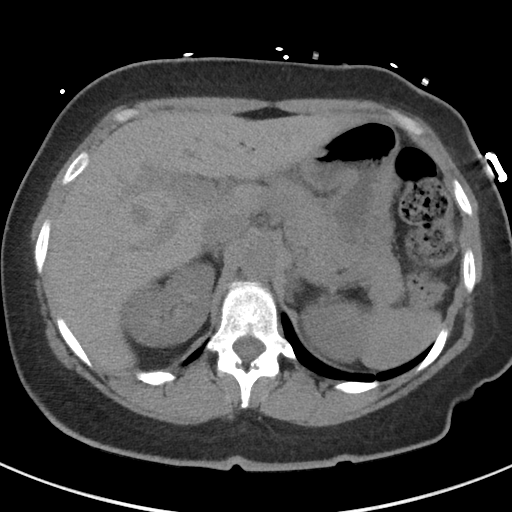
[im 71/81  soft-tissue]
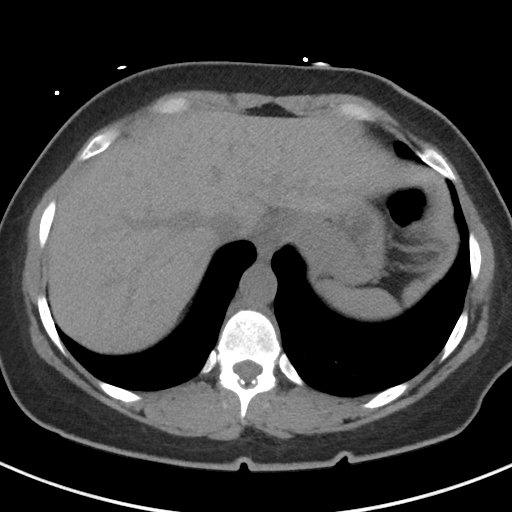
[im 77/81  soft-tissue]
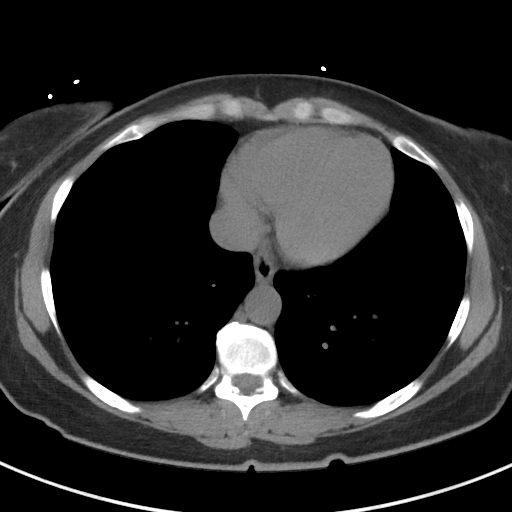

[Series 5: coronal · coronal · 0.71mm/px · 3 of 122 slices shown]
[im 41/122  soft-tissue]
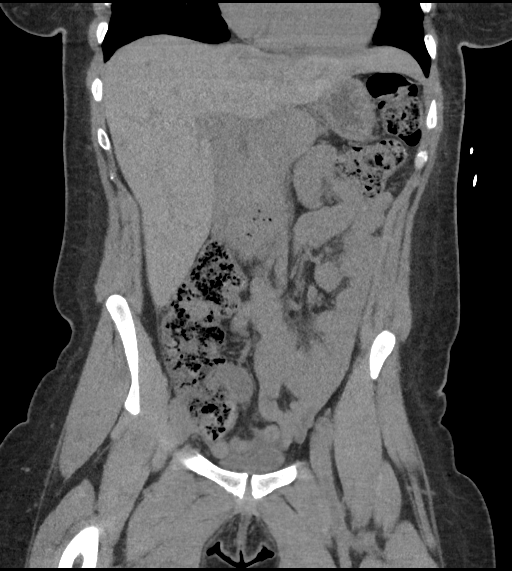
[im 54/122  soft-tissue]
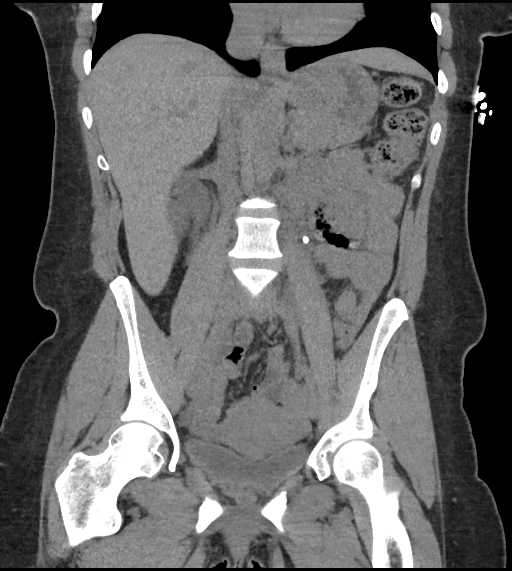
[im 68/122  soft-tissue]
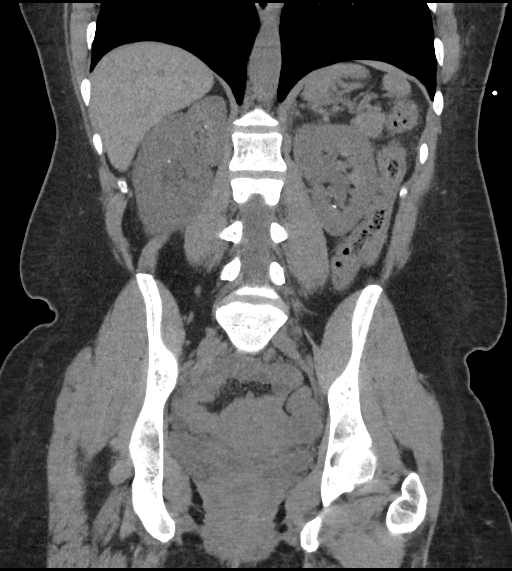

[15 of 46 positions shown; findings below may reference images not displayed]

FINDINGS: Lower chest:  Unremarkable.

Hepatobiliary: 1.1 cm poorly defined lesion in the left liver is
seen on image 15 series 2. Similar 1.6 cm poorly defined subtle hypo
attenuating lesion identified in the inferior right liver (image
21). Neither lesion has imaging features suggestive of cysts. There
is no evidence for gallstones, gallbladder wall thickening, or
pericholecystic fluid. No intrahepatic or extrahepatic biliary
dilation.

Pancreas: No focal mass lesion. No dilatation of the main duct. No
intraparenchymal cyst. No peripancreatic edema.

Spleen: No splenomegaly. No focal mass lesion.

Adrenals/Urinary Tract: No adrenal nodule or mass.

5-6 distinct tiny 1-2 mm stones are seen in the right kidney. There
is mild to moderate right hydroureteronephrosis down to the level of
the pelvic sidewall (just beyond the iliac vessels) where a 4 x 6 x
6 mm ureteral stone is identified.

Five stones are seen in the left kidney ranging in size from 1-2 mm
up to a 6 mm upper pole stone. Mild fullness left intrarenal
collecting system noted. 4 x 6 x 6 mm stone just anterior to the
left psoas muscle (image 35 series 2) is probably in the proximal
left ureter although the ureter is difficult to discretely identify
in this region.

No evidence for distal ureteral or bladder stones.

Stomach/Bowel: Stomach is nondistended. No gastric wall thickening.
No evidence of outlet obstruction. Duodenum is normally positioned
as is the ligament of Treitz. No small bowel wall thickening. No
small bowel dilatation. The terminal ileum is normal. The appendix
is normal. No gross colonic mass. No colonic wall thickening. No
substantial diverticular change.

Vascular/Lymphatic: There is no gastrohepatic or hepatoduodenal
ligament lymphadenopathy. No intraperitoneal or retroperitoneal
lymphadenopathy. No pelvic sidewall lymphadenopathy.

Reproductive: The uterus has normal CT imaging appearance. There is
no adnexal mass.

Other: Small volume intraperitoneal free fluid noted in the
cul-de-sac.

Musculoskeletal: Bone windows reveal no worrisome lytic or sclerotic
osseous lesions. Bilateral Petit's hernia are identified, left
greater than right, containing only fat.
IMPRESSION: 1. Bilateral nephrolithiasis with mild to moderate right
hydroureteronephrosis secondary to a distal right ureteral stone.
Mild fullness left intrarenal collecting system with probable
proximal left ureteral stone identified.
2. Small volume intraperitoneal free fluid, may be physiologic in a
reproductive age female.
3. Bilateral Petit's hernias, left greater than right, containing
only fat.

## 2018-08-18 ENCOUNTER — Ambulatory Visit: Payer: Self-pay | Admitting: Family Medicine

## 2018-08-22 ENCOUNTER — Other Ambulatory Visit (HOSPITAL_COMMUNITY)
Admission: RE | Admit: 2018-08-22 | Discharge: 2018-08-22 | Disposition: A | Discharge status: Home / Self Care | Payer: Self-pay | Source: Ambulatory Visit | Attending: Family Medicine | Admitting: Family Medicine

## 2018-08-22 ENCOUNTER — Encounter: Payer: Self-pay | Admitting: Family Medicine

## 2018-08-22 ENCOUNTER — Ambulatory Visit: Payer: No Typology Code available for payment source | Admitting: Family Medicine

## 2018-08-22 VITALS — BP 118/68 | HR 89 | Temp 98.6°F | Resp 14 | Ht 62.0 in | Wt 131.3 lb

## 2018-08-22 DIAGNOSIS — Z720 Tobacco use: Secondary | ICD-10-CM | POA: Diagnosis not present

## 2018-08-22 DIAGNOSIS — Z23 Encounter for immunization: Secondary | ICD-10-CM

## 2018-08-22 DIAGNOSIS — Z113 Encounter for screening for infections with a predominantly sexual mode of transmission: Secondary | ICD-10-CM | POA: Insufficient documentation

## 2018-08-22 DIAGNOSIS — Z7689 Persons encountering health services in other specified circumstances: Secondary | ICD-10-CM | POA: Diagnosis not present

## 2018-08-22 DIAGNOSIS — Z13220 Encounter for screening for lipoid disorders: Secondary | ICD-10-CM

## 2018-08-22 DIAGNOSIS — Z833 Family history of diabetes mellitus: Secondary | ICD-10-CM

## 2018-08-22 DIAGNOSIS — Z862 Personal history of diseases of the blood and blood-forming organs and certain disorders involving the immune mechanism: Secondary | ICD-10-CM

## 2018-08-22 DIAGNOSIS — Z8659 Personal history of other mental and behavioral disorders: Secondary | ICD-10-CM | POA: Diagnosis not present

## 2018-08-22 DIAGNOSIS — N200 Calculus of kidney: Secondary | ICD-10-CM

## 2018-08-22 DIAGNOSIS — Z1231 Encounter for screening mammogram for malignant neoplasm of breast: Secondary | ICD-10-CM

## 2018-08-22 MED ORDER — VARENICLINE TARTRATE 0.5 MG X 11 & 1 MG X 42 PO MISC: ORAL | 0 refills | Status: DC

## 2018-08-22 MED ORDER — VARENICLINE TARTRATE 1 MG PO TABS: 1.0000 mg | ORAL_TABLET | Freq: Two times a day (BID) | ORAL | 0 refills | Status: DC

## 2018-08-22 NOTE — Patient Instructions (Signed)
Here are some resources to help you if you feel you are in a mental health crisis:  National Suicide Prevention Lifeline - Call 1-800-273-8255  for help - Website with more resources: https://suicidepreventionlifeline.org/  [facility] Crisis Program - Call 336-538-1220 for help. - Mobile Crisis Program available 24 hours a day, 365 days a year. - Available for anyone of any age in [place] & [place] counties.  [facility] - Address: [address], [place] - Telephone: [phone]  - Hours of Operation: Sunday - Saturday - 8:00 a.m. - 8:00 p.m. - Medicaid, Medicare (Government Issued Only), BCBS, and Cash - Pay - Crisis Management, Outpatient Individual & Group Therapy, Psychiatrists on-site to provide medication management, In-Home Psychiatric Care, and Peer Support Care.  [facility] - Call 1-877-626-1772 for help. - Mobile Crisis Program available 24 hours a day, 365 days a year. - Available for anyone of any age in [place] & [place]    

## 2018-08-22 NOTE — Progress Notes (Signed)
Name: Joy Lewis   MRN: [id number]    DOB: 02-01-1976   Date:08/22/2018       Progress Note  Subjective  Chief Complaint  Chief Complaint  Patient presents with  . Establish Care    HPI  Pt presents to establish care and for the following:  Social: Pt works for [employer], had a fall and is wearing aback brace and right arm brace, is working with [employer] for these issues.  Taking hydrocodone PRN for pain. It has been 8 years since she had a regular PCP or GYN.  History Kidney Stones: She notes these come and go, has never needed lithotripsy. Not having frank hematuria, does not occasional LEFT flank swelling when she has sharp pain.  No abdominal pain. Was on tamsulosin but is no longer taking.  Tobacco abuse: She smokes 1/2ppd.  She has smoked for 17 years and would like quit.  Took Wellbutrin in the past and did not do well with this because it caused increase anxiety. Denies chest pain or shortness of breath, would like to try Chantix.  Bipolar/Schizoaffective disorder: She states she has a "history" of bipolar disorder and schizoaffective disorder.  She had treatment back in the 1990's, but nothing since then. She has 1 attempted suicide at age 73, nothing since then.  She feels really good right now, no SI/HI, no hallucinations.  She does not want referral to psychiatry - I did explain the treating bipolar and schizoaffective are out of the scope of primary care.   STI Screening: Has had same female partner for 4 years; we will do STI screening today, no symptoms. Did have tubal ligation, does use condoms.  Patient Active Problem List   Diagnosis Date Noted  . Tobacco abuse 08/22/2018  . Nephrolithiasis 08/22/2018  . History of bipolar disorder 08/22/2018    Past Surgical History:  Procedure Laterality Date  . FOREIGN BODY REMOVAL Left    middle finger  . FOREIGN BODY REMOVAL Left 08/05/2014   Procedure: REMOVAL FOREIGN BODY LEFT MIDDLE FINGER;  Surgeon: Daryll Brod, MD;  Location: [place];  Service: Orthopedics;  Laterality: Left;  FOREIGN BODY GIVEN TO PATIENT PER DR. Fredna Dow ORDER.  . TUBAL LIGATION      Family History  Problem Relation Age of Onset  . Hypertension Mother   . Diabetes Mother   . Stroke Mother   . Liver disease Mother   . COPD Father   . Hypertension Sister     Social History   Socioeconomic History  . Marital status: Single    Spouse name: Not on file  . Number of children: 3  . Years of education: Not on file  . Highest education level: Not on file  Occupational History    Employer: [employer]  . Financial resource strain: Somewhat hard  . Food insecurity:    Worry: Sometimes true    Inability: Sometimes true  . Transportation needs:    Medical: Yes    Non-medical: Yes  Tobacco Use  . Smoking status: Current Some Day Smoker    Packs/day: 0.50    Years: 17.00    Pack years: 8.50    Types: Cigarettes  . Smokeless tobacco: Never Used  . Tobacco comment: Started in 2003  Substance and Sexual Activity  . Alcohol use: Not Currently    Comment: occasionally  . Drug use: No  . Sexual activity: Yes    Birth control/protection: Condom  Lifestyle  .  Physical activity:    Days per week: 0 days    Minutes per session: 0 min  . Stress: Only a little  Relationships  . Social connections:    Talks on phone: More than three times a week    Gets together: More than three times a week    Attends religious service: Never    Active member of club or organization: No    Attends meetings of clubs or organizations: Never    Relationship status: Never married  . Intimate partner violence:    Fear of current or ex partner: No    Emotionally abused: No    Physically abused: No    Forced sexual activity: No  Other Topics Concern  . 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     Current Outpatient Medications:  .  HYDROcodone-acetaminophen (NORCO/VICODIN) 5-325 MG tablet, TK 1 T PO Q 8 H PRN FOR PAIN, Disp: , Rfl: 0 .  ibuprofen (ADVIL,MOTRIN) 200 MG tablet, Take 200 mg by mouth every 6 (six) hours as needed., Disp: , Rfl:   Allergies  Allergen Reactions  . Peanut-Containing Drug Products Hives and Swelling  . Shellfish Allergy Hives and Swelling  . Tomato Hives and Swelling  . Penicillins Hives    Has patient had a PCN reaction causing immediate rash, facial/tongue/throat swelling, SOB or lightheadedness with hypotension: no Has patient had a PCN reaction causing severe rash involving mucus membranes or skin necrosis: no Has patient had a PCN reaction that required hospitalization no Has patient had a PCN reaction occurring within the last 10 years: yes If all of the above answers are "NO", then may proceed with Cephalosporin use.      I personally reviewed active problem list, medication list, allergies, family history, social history, health maintenance with the patient/caregiver today.   ROS  Constitutional: Negative for fever or weight change.  Respiratory: Negative for cough and shortness of breath.   Cardiovascular: Negative for chest pain or palpitations.  Gastrointestinal: Negative for abdominal pain, no bowel changes.  Musculoskeletal: Negative for gait problem or joint swelling. She does have back and wrist braces on from workman's comp injuries. Skin: Negative for rash.  Neurological: Negative for dizziness or headache.  No other specific complaints in a complete review of systems (except as listed in HPI above).  Objective  Vitals:   08/22/18 1019  BP: 118/68  Pulse: 89  Resp: 14  Temp: 98.6 F (37 C)  TempSrc: Oral  SpO2: 99%  Weight: 131 lb 4.8 oz (59.6 kg)  Height: 5\' 2"  (1.575 m)   Body mass index is 24.02 kg/m.  Physical Exam  Constitutional:  Patient appears well-developed and well-nourished. No distress.  HENT: Head: Normocephalic and atraumatic. Ears: bilateral TMs with no erythema or effusion; Nose: Nose normal.  Eyes: Conjunctivae and EOM are normal. No scleral icterus.  Neck: Normal range of motion. Neck supple. No JVD present. No thyromegaly present.  Cardiovascular: Normal rate, regular rhythm and normal heart sounds.  No murmur heard. No BLE edema. Pulmonary/Chest: Effort normal and breath sounds normal. No respiratory distress.. Musculoskeletal: Normal range of motion, no joint effusions. No gross deformities Neurological: Pt is alert and oriented to person, place, and time. No cranial nerve deficit.  Coordination, balance, strength, speech and gait are normal.  Skin: Skin is warm and dry. No rash noted. No erythema.  Psychiatric: Patient has a normal mood and affect. behavior is normal. Judgment and thought content normal.  No results found for this or any previous visit (from the past 72 hour(s)).  PHQ2/9: Depression screen PHQ 2/9 08/22/2018  Decreased Interest 0  Down, Depressed, Hopeless 0  PHQ - 2 Score 0  Altered sleeping 0  Tired, decreased energy 0  Change in appetite 0  Feeling bad or failure about yourself  0  Trouble concentrating 0  Moving slowly or fidgety/restless 0  Suicidal thoughts 0  PHQ-9 Score 0  Difficult doing work/chores Not difficult at all   Fall Risk: Fall Risk  08/22/2018  Falls in the past year? 1  Number falls in past yr: 1  Injury with Fall? 1   Assessment & Plan  1. Tobacco abuse - Counseling provided. - varenicline (CHANTIX STARTING MONTH PAK) 0.5 MG X 11 & 1 MG X 42 tablet; Take one 0.5 mg tablet by mouth once daily for 3 days, then increase to one 0.5 mg tablet twice daily for 4 days, then increase to one 1 mg tablet twice daily.  Dispense: 53 tablet; Refill: 0 - varenicline (CHANTIX CONTINUING MONTH PAK) 1 MG tablet; Take 1 tablet (1 mg total) by mouth 2 (two) times daily.   Dispense: 180 tablet; Refill: 0  2. Nephrolithiasis - Stable  3. History of bipolar disorder - Stable - does not want to see psych  4. Encounter to establish care  5. History of anemia - CBC  6. Routine screening for STI (sexually transmitted infection) - HIV Antibody (routine testing w rflx) - RPR - Cervicovaginal ancillary only  7. Lipid screening - Lipid panel  8. Family history of diabetes mellitus - COMPLETE METABOLIC PANEL WITH GFR  9. Need for Tdap vaccination - Tdap vaccine greater than or equal to 7yo IM  10. Breast cancer screening by mammogram - MM 3D SCREEN BREAST BILATERAL; Future

## 2018-08-25 LAB — LIPID PANEL
CHOL/HDL RATIO: 3.4 (calc) (ref <5.0)
CHOLESTEROL: 164 mg/dL (ref <200)
HDL: 48 mg/dL — AB (ref >50)
LDL Cholesterol (Calc): 102 mg/dL (calc) — ABNORMAL HIGH
Non-HDL Cholesterol (Calc): 116 mg/dL (calc) (ref <130)
Triglycerides: 59 mg/dL (ref <150)

## 2018-08-25 LAB — COMPLETE METABOLIC PANEL WITH GFR
AG Ratio: 1.5 (calc) (ref 1.0–2.5)
ALT: 21 U/L (ref 6–29)
AST: 14 U/L (ref 10–30)
Albumin: 4.1 g/dL (ref 3.6–5.1)
Alkaline phosphatase (APISO): 64 U/L (ref 33–115)
BUN: 15 mg/dL (ref 7–25)
CALCIUM: 9.6 mg/dL (ref 8.6–10.2)
CO2: 25 mmol/L (ref 20–32)
CREATININE: 0.71 mg/dL (ref 0.50–1.10)
Chloride: 107 mmol/L (ref 98–110)
GFR, EST AFRICAN AMERICAN: 121 mL/min/{1.73_m2} (ref >=60)
GFR, Est Non African American: 104 mL/min/{1.73_m2} (ref >=60)
GLOBULIN: 2.8 g/dL (ref 1.9–3.7)
Glucose, Bld: 74 mg/dL (ref 65–99)
Potassium: 3.8 mmol/L (ref 3.5–5.3)
SODIUM: 139 mmol/L (ref 135–146)
TOTAL PROTEIN: 6.9 g/dL (ref 6.1–8.1)
Total Bilirubin: 0.5 mg/dL (ref 0.2–1.2)

## 2018-08-25 LAB — CERVICOVAGINAL ANCILLARY ONLY
Chlamydia: NEGATIVE
Neisseria Gonorrhea: NEGATIVE
Trichomonas: NEGATIVE

## 2018-08-25 LAB — CBC
HEMATOCRIT: 36.3 % (ref 35.0–45.0)
Hemoglobin: 12.7 g/dL (ref 11.7–15.5)
MCH: 34.2 pg — AB (ref 27.0–33.0)
MCHC: 35 g/dL (ref 32.0–36.0)
MCV: 97.8 fL (ref 80.0–100.0)
MPV: 9.9 fL (ref 7.5–12.5)
Platelets: 276 10*3/uL (ref 140–400)
RBC: 3.71 10*6/uL — AB (ref 3.80–5.10)
RDW: 12.3 % (ref 11.0–15.0)
WBC: 5.7 10*3/uL (ref 3.8–10.8)

## 2018-08-25 LAB — HIV ANTIBODY (ROUTINE TESTING W REFLEX): HIV: NONREACTIVE

## 2018-08-25 LAB — RPR: RPR Ser Ql: NONREACTIVE

## 2018-09-22 ENCOUNTER — Encounter: Payer: No Typology Code available for payment source | Admitting: Family Medicine

## 2019-05-27 ENCOUNTER — Emergency Department: Payer: Self-pay

## 2019-05-27 ENCOUNTER — Other Ambulatory Visit: Payer: Self-pay

## 2019-05-27 ENCOUNTER — Emergency Department
Admission: EM | Admit: 2019-05-27 | Discharge: 2019-05-27 | Disposition: A | Discharge status: Home / Self Care | Payer: Self-pay | Attending: Student | Admitting: Student

## 2019-05-27 DIAGNOSIS — R8290 Unspecified abnormal findings in urine: Secondary | ICD-10-CM | POA: Insufficient documentation

## 2019-05-27 DIAGNOSIS — J45909 Unspecified asthma, uncomplicated: Secondary | ICD-10-CM | POA: Insufficient documentation

## 2019-05-27 DIAGNOSIS — Z79899 Other long term (current) drug therapy: Secondary | ICD-10-CM | POA: Insufficient documentation

## 2019-05-27 DIAGNOSIS — R110 Nausea: Secondary | ICD-10-CM | POA: Insufficient documentation

## 2019-05-27 DIAGNOSIS — F17210 Nicotine dependence, cigarettes, uncomplicated: Secondary | ICD-10-CM | POA: Insufficient documentation

## 2019-05-27 DIAGNOSIS — R6883 Chills (without fever): Secondary | ICD-10-CM | POA: Insufficient documentation

## 2019-05-27 DIAGNOSIS — N23 Unspecified renal colic: Secondary | ICD-10-CM | POA: Insufficient documentation

## 2019-05-27 DIAGNOSIS — Z91010 Allergy to peanuts: Secondary | ICD-10-CM | POA: Insufficient documentation

## 2019-05-27 DIAGNOSIS — R109 Unspecified abdominal pain: Secondary | ICD-10-CM | POA: Insufficient documentation

## 2019-05-27 LAB — URINALYSIS, COMPLETE (UACMP) WITH MICROSCOPIC
Bacteria, UA: NONE SEEN
Bilirubin Urine: NEGATIVE
Glucose, UA: NEGATIVE mg/dL
Ketones, ur: NEGATIVE mg/dL
Leukocytes,Ua: NEGATIVE
Nitrite: NEGATIVE
Protein, ur: NEGATIVE mg/dL
Specific Gravity, Urine: 1.013 (ref 1.005–1.030)
pH: 5 (ref 5.0–8.0)

## 2019-05-27 LAB — CBC WITH DIFFERENTIAL/PLATELET
Abs Immature Granulocytes: 0.05 10*3/uL (ref 0.00–0.07)
Basophils Absolute: 0.1 10*3/uL (ref 0.0–0.1)
Basophils Relative: 1 %
Eosinophils Absolute: 0.4 10*3/uL (ref 0.0–0.5)
Eosinophils Relative: 6 %
HCT: 40.8 % (ref 36.0–46.0)
Hemoglobin: 13.5 g/dL (ref 12.0–15.0)
Immature Granulocytes: 1 %
Lymphocytes Relative: 28 %
Lymphs Abs: 1.8 10*3/uL (ref 0.7–4.0)
MCH: 33.7 pg (ref 26.0–34.0)
MCHC: 33.1 g/dL (ref 30.0–36.0)
MCV: 101.7 fL — ABNORMAL HIGH (ref 80.0–100.0)
Monocytes Absolute: 0.4 10*3/uL (ref 0.1–1.0)
Monocytes Relative: 7 %
Neutro Abs: 3.6 10*3/uL (ref 1.7–7.7)
Neutrophils Relative %: 57 %
Platelets: 286 10*3/uL (ref 150–400)
RBC: 4.01 MIL/uL (ref 3.87–5.11)
RDW: 13.4 % (ref 11.5–15.5)
WBC: 6.2 10*3/uL (ref 4.0–10.5)

## 2019-05-27 LAB — COMPREHENSIVE METABOLIC PANEL
ALT: 9 U/L (ref 0–44)
AST: 13 U/L — ABNORMAL LOW (ref 15–41)
Albumin: 3.7 g/dL (ref 3.5–5.0)
Alkaline Phosphatase: 67 U/L (ref 38–126)
Anion gap: 9 (ref 5–15)
BUN: 15 mg/dL (ref 6–20)
CO2: 23 mmol/L (ref 22–32)
Calcium: 8.9 mg/dL (ref 8.9–10.3)
Chloride: 104 mmol/L (ref 98–111)
Creatinine, Ser: 0.78 mg/dL (ref 0.44–1.00)
GFR calc Af Amer: >60 mL/min (ref >60)
GFR calc non Af Amer: >60 mL/min (ref >60)
Glucose, Bld: 110 mg/dL — ABNORMAL HIGH (ref 70–99)
Potassium: 3.9 mmol/L (ref 3.5–5.1)
Sodium: 136 mmol/L (ref 135–145)
Total Bilirubin: 0.6 mg/dL (ref 0.3–1.2)
Total Protein: 7.3 g/dL (ref 6.5–8.1)

## 2019-05-27 LAB — PREGNANCY, URINE: Preg Test, Ur: NEGATIVE

## 2019-05-27 LAB — LIPASE, BLOOD: Lipase: 32 U/L (ref 11–51)

## 2019-05-27 MED ORDER — ONDANSETRON HCL 4 MG PO TABS: 4.0000 mg | ORAL_TABLET | Freq: Every day | ORAL | 1 refills | Status: AC | PRN

## 2019-05-27 MED ORDER — KETOROLAC TROMETHAMINE 30 MG/ML IJ SOLN
15.0000 mg | Freq: Once | INTRAMUSCULAR | Status: AC
  Administered 2019-05-27: 15 mg via INTRAVENOUS

## 2019-05-27 MED ORDER — SODIUM CHLORIDE 0.9 % IV BOLUS
1000.0000 mL | Freq: Once | INTRAVENOUS | Status: AC
  Administered 2019-05-27: 1000 mL via INTRAVENOUS

## 2019-05-27 MED ORDER — IBUPROFEN 600 MG PO TABS: 600.0000 mg | ORAL_TABLET | Freq: Four times a day (QID) | ORAL | 0 refills | Status: DC | PRN

## 2019-05-27 MED ORDER — ONDANSETRON HCL 4 MG/2ML IJ SOLN
4.0000 mg | Freq: Once | INTRAMUSCULAR | Status: AC
  Administered 2019-05-27: 09:00:00 4 mg via INTRAVENOUS
  Filled 2019-05-27: qty 2

## 2019-05-27 NOTE — ED Notes (Signed)
Patient transported to CT 

## 2019-05-27 NOTE — Discharge Instructions (Signed)
Thank you for letting us take care of you in the emergency department today.   Please continue to take any regular, prescribed medications.   New medications we have prescribed:  - Zofran - as need for nausea - Ibuprofen - as needed for pain  Please follow up with: - Your primary care doctor to review your ER visit and follow up on your symptoms.   Please return to the ER for any new or worsening symptoms.

## 2019-05-27 NOTE — ED Triage Notes (Signed)
Pt c/o right flank pain with increased urinary frequency and nausea since Friday.

## 2019-05-27 NOTE — ED Provider Notes (Signed)
[facility] Emergency Department Provider Note  ____________________________________________   First MD Initiated Contact with Patient 05/27/19 [phone]     (approximate)  I have reviewed the triage vital signs and the nursing notes.  History  Chief Complaint Flank Pain    HPI Joy Lewis is a 43 y.o. female history of asthma, nephrolithiasis, who presents the emergency department for right-sided flank pain and urinary frequency.  Patient reports increased urinary frequency that has been ongoing for the last few weeks, but worsened over the last several days.  Recently (over the last few days) also associated with malodorous urine.  She reports twinges of right flank pain over the last several weeks, but again, over the last few days her pain has been increasing in severity.  Sharp, and comes and goes in waves.  Radiates somewhat to the suprapubic area.  She states the symptoms feel similar to prior episode of kidney stones.  Has not required lithotripsy in the past.  She has some associated nausea and chills, but no vomiting or fevers.  No hematuria.         Past Medical Hx Past Medical History:  Diagnosis Date  . Asthma   . Foreign body of finger of left hand 07/2014   left middle finger  . History of asthma    as a child  . History of kidney stones     Problem List Patient Active Problem List   Diagnosis Date Noted  . Tobacco abuse 08/22/2018  . Nephrolithiasis 08/22/2018  . History of bipolar disorder 08/22/2018    Past Surgical Hx Past Surgical History:  Procedure Laterality Date  . FOREIGN BODY REMOVAL Left    middle finger  . FOREIGN BODY REMOVAL Left 08/05/2014   Procedure: REMOVAL FOREIGN BODY LEFT MIDDLE FINGER;  Surgeon: Daryll Brod, MD;  Location: [place];  Service: Orthopedics;  Laterality: Left;  FOREIGN BODY GIVEN TO PATIENT PER DR. Fredna Dow ORDER.  . TUBAL LIGATION      Medications Prior to Admission  medications   Medication Sig Start Date End Date Taking? Authorizing Provider  HYDROcodone-acetaminophen (NORCO/VICODIN) 5-325 MG tablet TK 1 T PO Q 8 H PRN FOR PAIN 08/20/18   [provider]  ibuprofen (ADVIL,MOTRIN) 200 MG tablet Take 200 mg by mouth every 6 (six) hours as needed.    [provider]  varenicline (CHANTIX CONTINUING MONTH PAK) 1 MG tablet Take 1 tablet (1 mg total) by mouth 2 (two) times daily. 08/22/18   Hubbard Hartshorn, FNP  varenicline (CHANTIX STARTING MONTH PAK) 0.5 MG X 11 & 1 MG X 42 tablet Take one 0.5 mg tablet by mouth once daily for 3 days, then increase to one 0.5 mg tablet twice daily for 4 days, then increase to one 1 mg tablet twice daily. 08/22/18   Hubbard Hartshorn, FNP    Allergies Peanut-containing drug products, Shellfish allergy, Tomato, and Penicillins  Family Hx Family History  Problem Relation Age of Onset  . Hypertension Mother   . Diabetes Mother   . Stroke Mother   . Liver disease Mother   . COPD Father   . Hypertension Sister     Social Hx Social History   Tobacco Use  . Smoking status: Current Some Day Smoker    Packs/day: 0.50    Years: 17.00    Pack years: 8.50    Types: Cigarettes  . Smokeless tobacco: Never Used  . Tobacco comment: Started in 2003  Substance  Use Topics  . Alcohol use: Not Currently    Comment: occasionally  . Drug use: No     Review of Systems  Constitutional: Negative for fever. Negative for chills. Eyes: Negative for visual changes. ENT: Negative for sore throat. Cardiovascular: Negative for chest pain. Respiratory: Negative for shortness of breath. Gastrointestinal: Negative for abdominal pain. Negative for nausea. Negative for vomiting. Genitourinary: + urinary frequency, R flank pain Musculoskeletal: Negative for leg swelling. Skin: Negative for rash. Neurological: Negative for for headaches.   Physical Exam  Vital Signs: ED Triage Vitals  Enc Vitals Group     BP 05/27/19  0829 121/82     Pulse Rate 05/27/19 0829 82     Resp 05/27/19 0829 17     Temp 05/27/19 0829 99.2 F (37.3 C)     Temp Source 05/27/19 0829 Oral     SpO2 05/27/19 0829 100 %     Weight 05/27/19 0830 165 lb (74.8 kg)     Height 05/27/19 0830 5' (1.524 m)     Head Circumference --      Peak Flow --      Pain Score 05/27/19 0829 6     Pain Loc --      Pain Edu? --      Excl. in [place]? --     Constitutional: Alert and oriented.  Eyes: Conjunctivae clear. Sclera anicteric. Head: Normocephalic. Atraumatic. Nose: No congestion. No rhinorrhea. Mouth/Throat: Mucous membranes are moist.  Neck: No stridor.   Cardiovascular: Normal rate, regular rhythm. No murmurs. Extremities well perfused. Respiratory: Normal respiratory effort.  Lungs CTAB. Gastrointestinal: Soft and non-tender. No distention. No CVA tenderness. Musculoskeletal: No lower extremity edema. Neurologic:  Normal speech and language. No gross focal neurologic deficits are appreciated.  Skin: Skin is warm, dry and intact. No rash noted. Psychiatric: Mood and affect are appropriate for situation.  EKG  N/A    Radiology  CT renal protocol: IMPRESSION: 1. Multiple nonobstructing calculi in each kidney. No hydronephrosis or ureteral calculus on either side. Urinary bladder wall thickness normal. 2. No bowel obstruction. No abscess in the abdomen or pelvis. Appendix appears normal. 3. Small amount of fluid in the cul-de-sac which may be indicative of recent ovarian cyst rupture. 4. Apparent leiomyomatous uterus. 5. Stable appearing noncystic liver masses. Suspect benign etiology given stability since the 2017 study.     Procedures  Procedure(s) performed (including critical care):  Procedures   Initial Impression / Assessment and Plan / ED Course  43 y.o. female with history of nephrolithiasis who presents to the ED for urinary frequency and right-sided renal colic, as above.  Ddx: nephrolithiasis, UTI   Plan: labs, urine, fluids, symptom control, imaging.  Patient agreeable with plan.  Urine notable for hemoglobin, otherwise negative for infection.  CT with nonobstructing calculi, no ureteral calculi or hydronephrosis.  Small amount of fluid in the cul-de-sac which may be indicative of recent ovarian cyst rupture. This may be the source of her symptoms, or perhaps an already/recently passed stone. Updated patient on results. She is feeling improved. Will plan for discharge with symptomatic support, advised PCP follow up, given return precautions. Patient agreeable with plan.    Final Clinical Impression(s) / ED Diagnosis  Final diagnoses:  Right flank pain  Renal colic on right side       Note:  This document was prepared using Dragon voice recognition software and may include unintentional dictation errors.   Lilia Pro., MD 05/27/19 [phone]

## 2020-03-09 ENCOUNTER — Emergency Department
Admission: EM | Admit: 2020-03-09 | Discharge: 2020-03-09 | Disposition: A | Discharge status: Home / Self Care | Payer: PRIVATE HEALTH INSURANCE | Attending: Emergency Medicine | Admitting: Emergency Medicine

## 2020-03-09 ENCOUNTER — Other Ambulatory Visit: Payer: Self-pay

## 2020-03-09 ENCOUNTER — Emergency Department: Payer: PRIVATE HEALTH INSURANCE

## 2020-03-09 DIAGNOSIS — N200 Calculus of kidney: Secondary | ICD-10-CM | POA: Insufficient documentation

## 2020-03-09 DIAGNOSIS — N12 Tubulo-interstitial nephritis, not specified as acute or chronic: Secondary | ICD-10-CM

## 2020-03-09 DIAGNOSIS — Z720 Tobacco use: Secondary | ICD-10-CM | POA: Insufficient documentation

## 2020-03-09 DIAGNOSIS — N10 Acute tubulo-interstitial nephritis: Secondary | ICD-10-CM | POA: Insufficient documentation

## 2020-03-09 LAB — URINALYSIS, COMPLETE (UACMP) WITH MICROSCOPIC
Bacteria, UA: NONE SEEN
Bilirubin Urine: NEGATIVE
Glucose, UA: NEGATIVE mg/dL
Ketones, ur: NEGATIVE mg/dL
Nitrite: NEGATIVE
Protein, ur: 100 mg/dL — AB
RBC / HPF: >50 RBC/hpf — ABNORMAL HIGH (ref 0–5)
Specific Gravity, Urine: 1.015 (ref 1.005–1.030)
Squamous Epithelial / LPF: NONE SEEN (ref 0–5)
WBC, UA: >50 WBC/hpf — ABNORMAL HIGH (ref 0–5)
pH: 5 (ref 5.0–8.0)

## 2020-03-09 MED ORDER — ONDANSETRON 4 MG PO TBDP
4.0000 mg | ORAL_TABLET | Freq: Once | ORAL | Status: AC
  Administered 2020-03-09: 4 mg via ORAL
  Filled 2020-03-09: qty 1

## 2020-03-09 MED ORDER — HYDROCODONE-ACETAMINOPHEN 5-325 MG PO TABS: 1.0000 | ORAL_TABLET | Freq: Four times a day (QID) | ORAL | 0 refills | Status: AC | PRN

## 2020-03-09 MED ORDER — LEVOFLOXACIN 750 MG PO TABS
750.0000 mg | ORAL_TABLET | Freq: Once | ORAL | Status: AC
  Administered 2020-03-09: 750 mg via ORAL
  Filled 2020-03-09: qty 1

## 2020-03-09 MED ORDER — ONDANSETRON 4 MG PO TBDP
4.0000 mg | ORAL_TABLET | Freq: Three times a day (TID) | ORAL | 0 refills | Status: DC | PRN
Start: 2020-03-09 — End: 2022-04-25

## 2020-03-09 MED ORDER — LEVOFLOXACIN 750 MG PO TABS
750.0000 mg | ORAL_TABLET | Freq: Every day | ORAL | 0 refills | Status: AC
Start: 2020-03-09 — End: 2020-03-13

## 2020-03-09 NOTE — Discharge Instructions (Addendum)
Take Levaquin once a day for 4 days. You have also been prescribed Zofran.   Please take Zofran with Vicodin to prevent nausea. Vicodin can be taken for pain.  Please start a stool softener such as MiraLAX and a probiotic. Return to the emergency department with new or worsening symptoms.

## 2020-03-09 NOTE — ED Provider Notes (Signed)
Emergency Department Provider Note  ____________________________________________  Time seen: Approximately 5:20 PM  I have reviewed the triage vital signs and the nursing notes.   HISTORY  Chief Complaint Urinary Tract Infection   Historian Patient    HPI Joy Lewis is a 44 y.o. female presents to the emergency department with dysuria and flank pain.  Patient states that she has a history of nephrolithiasis and over the past 2 days has passed several small stones through her urine.  Patient states that she has noticed some blood in her urine.  She states that her pain is improved since passing the stones but she still has dysuria.  She denies increased urinary frequency.  She states that she had fever several days ago.  She states that she thinks that fever has resolved but she still has nausea and occasionally has chills.  She has had pyelonephritis in the past.  No other alleviating measures have been attempted.   Past Medical History:  Diagnosis Date  . Asthma   . Foreign body of finger of left hand 07/2014   left middle finger  . History of asthma    as a child  . History of kidney stones      Immunizations up to date:  Yes.     Past Medical History:  Diagnosis Date  . Asthma   . Foreign body of finger of left hand 07/2014   left middle finger  . History of asthma    as a child  . History of kidney stones     Patient Active Problem List   Diagnosis Date Noted  . Tobacco abuse 08/22/2018  . Nephrolithiasis 08/22/2018  . History of bipolar disorder 08/22/2018    Past Surgical History:  Procedure Laterality Date  . FOREIGN BODY REMOVAL Left    middle finger  . FOREIGN BODY REMOVAL Left 08/05/2014   Procedure: REMOVAL FOREIGN BODY LEFT MIDDLE FINGER;  Surgeon: Daryll Brod, MD;  Location: [place];  Service: Orthopedics;  Laterality: Left;  FOREIGN BODY GIVEN TO PATIENT PER DR. Fredna Dow ORDER.  . TUBAL LIGATION      Prior to Admission  medications   Medication Sig Start Date End Date Taking? Authorizing Provider  HYDROcodone-acetaminophen (NORCO) 5-325 MG tablet Take 1 tablet by mouth every 6 (six) hours as needed for up to 3 days. 03/09/20 03/12/20  Lannie Fields, PA-C  ibuprofen (ADVIL) 600 MG tablet Take 1 tablet (600 mg total) by mouth every 6 (six) hours as needed. 05/27/19   Lilia Pro., MD  levofloxacin (LEVAQUIN) 750 MG tablet Take 1 tablet (750 mg total) by mouth daily for 4 days. 03/09/20 03/13/20  Lannie Fields, PA-C  ondansetron (ZOFRAN ODT) 4 MG disintegrating tablet Take 1 tablet (4 mg total) by mouth every 8 (eight) hours as needed for nausea or vomiting. 03/09/20   Vallarie Mare M, PA-C  ondansetron (ZOFRAN) 4 MG tablet Take 1 tablet (4 mg total) by mouth daily as needed for nausea or vomiting. 05/27/19 05/26/20  Lilia Pro., MD  varenicline (CHANTIX CONTINUING MONTH PAK) 1 MG tablet Take 1 tablet (1 mg total) by mouth 2 (two) times daily. 08/22/18   Hubbard Hartshorn, FNP  varenicline (CHANTIX STARTING MONTH PAK) 0.5 MG X 11 & 1 MG X 42 tablet Take one 0.5 mg tablet by mouth once daily for 3 days, then increase to one 0.5 mg tablet twice daily for 4 days, then increase to one 1 mg tablet twice daily.  08/22/18   Hubbard Hartshorn, FNP    Allergies Peanut-containing drug products, Shellfish allergy, Tomato, and Penicillins  Family History  Problem Relation Age of Onset  . Hypertension Mother   . Diabetes Mother   . Stroke Mother   . Liver disease Mother   . COPD Father   . Hypertension Sister     Social History Social History   Tobacco Use  . Smoking status: Current Some Day Smoker    Packs/day: 0.50    Years: 17.00    Pack years: 8.50    Types: Cigarettes  . Smokeless tobacco: Never Used  . Tobacco comment: Started in 2003  Vaping Use  . Vaping Use: Never used  Substance Use Topics  . Alcohol use: Not Currently    Comment: occasionally  . Drug use: No     Review of Systems  Constitutional: No  fever/chills Eyes:  No discharge ENT: No upper respiratory complaints. Respiratory: no cough. No SOB/ use of accessory muscles to breath Gastrointestinal:   No nausea, no vomiting.  No diarrhea.  No constipation. Genitourinary: Patient has dysuria.  Musculoskeletal: Negative for musculoskeletal pain. Skin: Negative for rash, abrasions, lacerations, ecchymosis.   ____________________________________________   PHYSICAL EXAM:  VITAL SIGNS: ED Triage Vitals  Enc Vitals Group     BP 03/09/20 1510 104/63     Pulse Rate 03/09/20 1510 95     Resp 03/09/20 1510 16     Temp 03/09/20 1510 99.1 F (37.3 C)     Temp Source 03/09/20 1510 Oral     SpO2 03/09/20 1510 96 %     Weight 03/09/20 1511 165 lb (74.8 kg)     Height 03/09/20 1511 5' (1.524 m)     Head Circumference --      Peak Flow --      Pain Score 03/09/20 1511 4     Pain Loc --      Pain Edu? --      Excl. in [place]? --      Constitutional: Alert and oriented. Well appearing and in no acute distress. Eyes: Conjunctivae are normal. PERRL. EOMI. Head: Atraumatic. Cardiovascular: Normal rate, regular rhythm. Normal S1 and S2.  Good peripheral circulation. Respiratory: Normal respiratory effort without tachypnea or retractions. Lungs CTAB. Good air entry to the bases with no decreased or absent breath sounds Gastrointestinal: Bowel sounds x 4 quadrants. Soft and nontender to palpation. No guarding or rigidity. No distention. Genitourinary: No CVA tenderness.  Patient does have suprapubic tenderness to palpation. Musculoskeletal: Full range of motion to all extremities. No obvious deformities noted Neurologic:  Normal for age. No gross focal neurologic deficits are appreciated.  Skin:  Skin is warm, dry and intact. No rash noted. Psychiatric: Mood and affect are normal for age. Speech and behavior are normal.   ____________________________________________   LABS (all labs ordered are listed, but only abnormal results are  displayed)  Labs Reviewed  URINALYSIS, COMPLETE (UACMP) WITH MICROSCOPIC - Abnormal; Notable for the following components:      Result Value   Color, Urine YELLOW (*)    APPearance TURBID (*)    Hgb urine dipstick MODERATE (*)    Protein, ur 100 (*)    Leukocytes,Ua LARGE (*)    RBC / HPF >50 (*)    WBC, UA >50 (*)    All other components within normal limits  URINE CULTURE   ____________________________________________  EKG   ____________________________________________  RADIOLOGY Unk Pinto, personally viewed and  evaluated these images (plain radiographs) as part of my medical decision making, as well as reviewing the written report by the radiologist.  CT Renal Stone Study  Result Date: 03/09/2020 CLINICAL DATA:  Flank pain, passing kidney stones, burning urination EXAM: CT ABDOMEN AND PELVIS WITHOUT CONTRAST TECHNIQUE: Multidetector CT imaging of the abdomen and pelvis was performed following the standard protocol without IV contrast. COMPARISON:  05/27/2019 FINDINGS: Lower chest: No acute abnormality. Hepatobiliary: No solid liver abnormality is seen. No gallstones, gallbladder wall thickening, or biliary dilatation. Pancreas: Unremarkable. No pancreatic ductal dilatation or surrounding inflammatory changes. Spleen: Normal in size without significant abnormality. Adrenals/Urinary Tract: Adrenal glands are unremarkable. Multiple small nonobstructive calculi in the bilateral lower poles of the kidneys. No ureteral calculi or hydronephrosis. Bladder is unremarkable. Stomach/Bowel: Stomach is within normal limits. Appendix appears normal. No evidence of bowel wall thickening, distention, or inflammatory changes. Vascular/Lymphatic: No significant vascular findings are present. No enlarged abdominal or pelvic lymph nodes. Reproductive: No mass or other significant abnormality. Other: There is a fat containing left lumbar hernia (series 2, image 32). Trace free fluid in the low  pelvis (series 2, image 65). Musculoskeletal: No acute or significant osseous findings. IMPRESSION: 1. Multiple small nonobstructive calculi in the bilateral lower poles of the kidneys. No ureteral calculi or hydronephrosis. 2. Fat containing left lumbar hernia. 3. Trace free fluid in the low pelvis, likely functional in the reproductive age setting. Electronically Signed   By: Eddie Candle M.D.   On: 03/09/2020 16:28    ____________________________________________    PROCEDURES  Procedure(s) performed:     Procedures     Medications  levofloxacin (LEVAQUIN) tablet 750 mg (750 mg Oral Given 03/09/20 1700)  ondansetron (ZOFRAN-ODT) disintegrating tablet 4 mg (4 mg Oral Given 03/09/20 1700)     ____________________________________________   INITIAL IMPRESSION / ASSESSMENT AND PLAN / ED COURSE  Pertinent labs & imaging results that were available during my care of the patient were reviewed by me and considered in my medical decision making (see chart for details).      Assessment and plan Pyelonephritis 44 year old female presents to the emergency department with persistent dysuria and suprapubic pain after passing small stones earlier in the week.  Vital signs are reassuring at triage.  On physical exam, patient had no CVA tenderness but did have some suprapubic discomfort to palpation.  Urinalysis indicated blood and a large amount of leukocytes.  Urine culture is pending.  CT renal stone study showed multiple nonobstructing renal stones.  Patient was given Levaquin in the emergency department.  She was discharged with Levaquin for 4 more days.  She was also prescribed Vicodin for pain. Return precautions were given to return with new or worsening symptoms.    ____________________________________________  FINAL CLINICAL IMPRESSION(S) / ED DIAGNOSES  Final diagnoses:  Pyelonephritis      NEW MEDICATIONS STARTED DURING THIS VISIT:  ED Discharge Orders          Ordered    levofloxacin (LEVAQUIN) 750 MG tablet  Daily     Discontinue  Reprint     03/09/20 1642    ondansetron (ZOFRAN ODT) 4 MG disintegrating tablet  Every 8 hours PRN     Discontinue  Reprint     03/09/20 1643    HYDROcodone-acetaminophen (NORCO) 5-325 MG tablet  Every 6 hours PRN     Discontinue  Reprint     03/09/20 1644              This  chart was dictated using voice recognition software/Dragon. Despite best efforts to proofread, errors can occur which can change the meaning. Any change was purely unintentional.     Karren Cobble 03/09/20 1724    Carrie Mew, MD 03/09/20 2245

## 2020-03-09 NOTE — ED Triage Notes (Signed)
Pt states that she has been passing kidney stones since Sat. And has been having urinary issues since. Pt reports burning when she urinates.

## 2020-03-12 LAB — URINE CULTURE: Culture: >=100000 — AB

## 2022-03-30 ENCOUNTER — Ambulatory Visit: Payer: Self-pay

## 2022-03-30 NOTE — Telephone Encounter (Signed)
Patient called, left VM to return the call to speak with a nurse.    Summary: Blood with intercourse since March Advice   Pt is calling to report that she has blood with intercourse since March. Pt went to UC and ruled out STI. Pt reports bumps in her cervix that are filled with blood. Maternal grandmother had cervial cancer. Pt is concerned.

## 2022-03-30 NOTE — Telephone Encounter (Signed)
  Chief Complaint: Vaginal bleeding after intercourse Symptoms: ibid Frequency: since march Pertinent Negatives: Patient denies  Disposition: '[]'$ ED /'[]'$ Urgent Care (no appt availability in office) / '[x]'$ Appointment(In office/virtual)/ '[]'$  [facility]/ '[]'$ Home Care/ '[]'$ Refused Recommended Disposition /'[]'$ [place] [facility]/ '[]'$  Follow-up with PCP Additional Notes: Pt has been having vaginal bleeding after intercourse since March. Pt was seen at [facility] where pt states there are blister filled with blood on her cervix. Made new GYN appt for pt.  For Tuesday 04/03/2022.

## 2022-03-30 NOTE — Telephone Encounter (Signed)
Call dropped. Tried to return call to finish triage and help pt get an appointment with a gyn, call went to voice mail. LMOM to return call     Chief Complaint: Vaginal bleeding after intercourse Symptoms: ibid Frequency: since march Pertinent Negatives: Patient denies  Disposition: '[]'$ ED /'[]'$ Urgent Care (no appt availability in office) / '[]'$ Appointment(In office/virtual)/ '[]'$  [facility]/ '[]'$ Home Care/ '[]'$ Refused Recommended Disposition /'[]'$ [place] [facility]/ '[]'$  Follow-up with PCP Additional Notes:  Summary: Blood with intercourse since March Advice   Pt is calling to report that she has blood with intercourse since March. Pt went to UC and ruled out STI. Pt reports bumps in her cervix that are filled with blood. Maternal grandmother had cervial cancer. Pt is concerned.      Reason for Disposition  Bleeding or spotting occurs after sex (Exception: First intercourse.)  Protocols used: Vaginal Bleeding - Abnormal-A-AH

## 2022-04-03 ENCOUNTER — Encounter: Payer: Self-pay | Admitting: Radiology

## 2022-04-04 ENCOUNTER — Other Ambulatory Visit (HOSPITAL_COMMUNITY)
Admission: RE | Admit: 2022-04-04 | Discharge: 2022-04-04 | Disposition: A | Discharge status: Home / Self Care | Payer: 59 | Source: Ambulatory Visit | Attending: Radiology | Admitting: Radiology

## 2022-04-04 ENCOUNTER — Encounter: Payer: Self-pay | Admitting: Radiology

## 2022-04-04 ENCOUNTER — Ambulatory Visit (INDEPENDENT_AMBULATORY_CARE_PROVIDER_SITE_OTHER): Payer: 59 | Admitting: Radiology

## 2022-04-04 VITALS — BP 122/78 | Ht 61.0 in | Wt 188.0 lb

## 2022-04-04 DIAGNOSIS — Z01419 Encounter for gynecological examination (general) (routine) without abnormal findings: Secondary | ICD-10-CM | POA: Insufficient documentation

## 2022-04-04 DIAGNOSIS — N760 Acute vaginitis: Secondary | ICD-10-CM | POA: Diagnosis not present

## 2022-04-04 DIAGNOSIS — N930 Postcoital and contact bleeding: Secondary | ICD-10-CM | POA: Diagnosis not present

## 2022-04-04 DIAGNOSIS — M79604 Pain in right leg: Secondary | ICD-10-CM

## 2022-04-04 DIAGNOSIS — N841 Polyp of cervix uteri: Secondary | ICD-10-CM

## 2022-04-04 DIAGNOSIS — M545 Low back pain, unspecified: Secondary | ICD-10-CM

## 2022-04-04 DIAGNOSIS — R1031 Right lower quadrant pain: Secondary | ICD-10-CM

## 2022-04-04 DIAGNOSIS — Z1211 Encounter for screening for malignant neoplasm of colon: Secondary | ICD-10-CM

## 2022-04-04 DIAGNOSIS — Z1231 Encounter for screening mammogram for malignant neoplasm of breast: Secondary | ICD-10-CM

## 2022-04-04 DIAGNOSIS — B9689 Other specified bacterial agents as the cause of diseases classified elsewhere: Secondary | ICD-10-CM

## 2022-04-04 NOTE — Progress Notes (Signed)
Joy Lewis 03-04-1976 [id number]   History:  46 y.o. G3P3 presents for annual exam and problem visit. She has multiple concerns she would like addressed today. Complains of post coital bleeding since 11/2021, noticed "lump" on cervix, watery vaginal discharge (tx'd for BV, negative STI screening at urgent care last week) right lower back pain--radiates down hip/leg (hx kidney stones), lump occasionally right lower side of abdomen.   Gynecologic History Patient's last menstrual period was 03/30/2022 (exact date). Period Duration (Days): 5 Period Pattern: (!) Irregular Menstrual Flow: Heavy Dysmenorrhea: (!) Moderate (moderate to severe) Dysmenorrhea Symptoms: Cramping Contraception/Family planning: tubal ligation Sexually active: yes Last Pap: 2013. Results were: normal Last mammogram: never  Obstetric History OB History  Gravida Para Term Preterm AB Living  '3 3       3  '$ SAB IAB Ectopic Multiple Live Births          3    # Outcome Date GA Lbr Len/2nd Weight Sex Delivery Anes PTL Lv  3 Para           2 Para           1 Para              The following portions of the patient's history were reviewed and updated as appropriate: allergies, current medications, past family history, past medical history, past social history, past surgical history, and problem list.  Review of Systems Pertinent items noted in HPI and remainder of comprehensive ROS otherwise negative.   Past medical history, past surgical history, family history and social history were all reviewed and documented in the EPIC chart.   Exam:  Vitals:   04/04/22 1353  BP: 122/78  Weight: 188 lb (85.3 kg)  Height: '5\' 1"'$  (1.549 m)   Body mass index is 35.52 kg/m.  General appearance:  Normal Thyroid:  Symmetrical, normal in size, without palpable masses or nodularity. Respiratory  Auscultation:  Clear without wheezing or rhonchi Cardiovascular  Auscultation:  Regular rate, without rubs, murmurs or  gallops  Edema/varicosities:  Not grossly evident Abdominal  Soft,nontender, without masses, guarding or rebound.  Liver/spleen:  No organomegaly noted  Hernia:  None appreciated  Skin  Inspection:  Grossly normal Breasts: Examined lying and sitting.   Right: Without masses, retractions, nipple discharge or axillary adenopathy.   Left: Without masses, retractions, nipple discharge or axillary adenopathy. Genitourinary   Inguinal/mons:  Normal without inguinal adenopathy  External genitalia:  Normal appearing vulva with no masses, tenderness, or lesions  BUS/Urethra/Skene's glands:  Normal without masses or exudate  Vagina:  Normal appearing with normal color and discharge, no lesions  Cervix:  Normal appearing without discharge or lesions. 1cm polyp present at cervical os. After consent was obtained I used a polyp forcep to gently twist the polyp off. It was collected for pathology.  Uterus:  Normal in size, shape and contour.  Mobile, nontender  Adnexa/parametria:     Rt: Normal in size, without masses or tenderness.   Lt: Normal in size, without masses or tenderness.  Anus and perineum: Normal   Leatrice Jewels, CMA present for exam  Assessment/Plan:   1. Well woman exam with routine gynecological exam  - Cytology - PAP( [place])  2. PCB (post coital bleeding)   3. Polyp at cervical os  - Surgical pathology( [place]/ [facility]) - US Transvaginal Non-OB; Future  4. Colon cancer screening  - Cologuard  5. BV (bacterial vaginosis) Continue flagyl from treatment at UC  6.  Encounter for screening mammogram for malignant neoplasm of breast Schedule screening mammo at the Breast center  7. Low back pain radiating to right leg F/U with PCP for further work up  8. Right lower quadrant abdominal pain U/S will r/o GYN cause    Discussed SBE, pap, colonoscopy and DEXA screening as directed/appropriate. Recommend 115mns of exercise weekly, including weight bearing  exercise. Encouraged the use of seatbelts and sunscreen. Return in 1 year for annual or as needed.   CRubbie BattiestB WHNP-BC 2:29 PM 04/04/2022

## 2022-04-06 LAB — SURGICAL PATHOLOGY

## 2022-04-09 LAB — CYTOLOGY - PAP
Comment: NEGATIVE
Diagnosis: NEGATIVE
High risk HPV: NEGATIVE

## 2022-04-18 ENCOUNTER — Telehealth: Payer: Self-pay

## 2022-04-18 DIAGNOSIS — R195 Other fecal abnormalities: Secondary | ICD-10-CM

## 2022-04-18 LAB — COLOGUARD: COLOGUARD: POSITIVE — AB

## 2022-04-18 NOTE — Telephone Encounter (Signed)
Referral sent. Pt was anxious about getting soonest appt available. Pt advised that if she didn't hear from the GI office by the end of the week, then to call them on Monday to make appt. Their phone number and address was provided to pt. Pt voiced understanding.

## 2022-04-19 ENCOUNTER — Other Ambulatory Visit: Payer: Self-pay

## 2022-04-19 ENCOUNTER — Telehealth: Payer: Self-pay

## 2022-04-19 DIAGNOSIS — R195 Other fecal abnormalities: Secondary | ICD-10-CM

## 2022-04-19 MED ORDER — NA SULFATE-K SULFATE-MG SULF 17.5-3.13-1.6 GM/177ML PO SOLN: 1.0000 | Freq: Once | ORAL | 0 refills | Status: AC

## 2022-04-19 NOTE — Telephone Encounter (Signed)
Gastroenterology Pre-Procedure Review  Request Date: 05/24/22 Requesting Physician: Dr. Marius Ditch  PATIENT REVIEW QUESTIONS: The patient responded to the following health history questions as indicated:    1. Are you having any GI issues? yes (constipation and diarrhea x 2 months positive colorectal screening) 2. Do you have a personal history of Polyps? no 3. Do you have a family history of Colon Cancer or Polyps? no 4. Diabetes Mellitus? no 5. Joint replacements in the past 12 months?no 6. Major health problems in the past 3 months?no 7. Any artificial heart valves, MVP, or defibrillator?no    MEDICATIONS & ALLERGIES:    Patient reports the following regarding taking any anticoagulation/antiplatelet therapy:   Plavix, Coumadin, Eliquis, Xarelto, Lovenox, Pradaxa, Brilinta, or Effient? no Aspirin? no  Patient confirms/reports the following medications:  Current Outpatient Medications  Medication Sig Dispense Refill   ibuprofen (ADVIL) 600 MG tablet Take 1 tablet (600 mg total) by mouth every 6 (six) hours as needed. 30 tablet 0   metroNIDAZOLE (FLAGYL) 500 MG tablet Take 500 mg by mouth 2 (two) times daily.     ondansetron (ZOFRAN ODT) 4 MG disintegrating tablet Take 1 tablet (4 mg total) by mouth every 8 (eight) hours as needed for nausea or vomiting. (Patient not taking: Reported on 04/04/2022) 20 tablet 0   varenicline (CHANTIX CONTINUING MONTH PAK) 1 MG tablet Take 1 tablet (1 mg total) by mouth 2 (two) times daily. (Patient not taking: Reported on 04/04/2022) 180 tablet 0   varenicline (CHANTIX STARTING MONTH PAK) 0.5 MG X 11 & 1 MG X 42 tablet Take one 0.5 mg tablet by mouth once daily for 3 days, then increase to one 0.5 mg tablet twice daily for 4 days, then increase to one 1 mg tablet twice daily. (Patient not taking: Reported on 04/04/2022) 53 tablet 0   No current facility-administered medications for this visit.    Patient confirms/reports the following allergies:  Allergies   Allergen Reactions   Justicia Adhatoda (Malabar Nut Tree) [Justicia Adhatoda] Anaphylaxis   Peanut-Containing Drug Products Hives and Swelling   Shellfish Allergy Hives and Swelling   Tomato Hives and Swelling   Penicillins Hives    Has patient had a PCN reaction causing immediate rash, facial/tongue/throat swelling, SOB or lightheadedness with hypotension: no Has patient had a PCN reaction causing severe rash involving mucus membranes or skin necrosis: no Has patient had a PCN reaction that required hospitalization no Has patient had a PCN reaction occurring within the last 10 years: yes If all of the above answers are "NO", then may proceed with Cephalosporin use.      No orders of the defined types were placed in this encounter.   AUTHORIZATION INFORMATION Primary Insurance: 1D#: Group #:  Secondary Insurance: 1D#: Group #:  SCHEDULE INFORMATION: Date:  Time: Location:

## 2022-04-25 ENCOUNTER — Ambulatory Visit (INDEPENDENT_AMBULATORY_CARE_PROVIDER_SITE_OTHER): Payer: 59

## 2022-04-25 ENCOUNTER — Encounter: Payer: Self-pay | Admitting: Radiology

## 2022-04-25 ENCOUNTER — Ambulatory Visit (INDEPENDENT_AMBULATORY_CARE_PROVIDER_SITE_OTHER): Payer: 59 | Admitting: Radiology

## 2022-04-25 VITALS — BP 140/82

## 2022-04-25 DIAGNOSIS — N841 Polyp of cervix uteri: Secondary | ICD-10-CM

## 2022-04-25 NOTE — Progress Notes (Signed)
   Joy Lewis 27-Mar-1976 [id number]   History:  46 y.o. here for follow up ultrasound for post coital bleeding. A cervical polyp was removed at her annual exam, benign pathology. Was also having some RLQ which resolved earlier this week. Of note- she had a positive cologuard test and has been scheduled for a colonoscopy in 4 weeks.  Gynecologic History Patient's last menstrual period was 03/30/2022 (exact date).    Obstetric History OB History  Gravida Para Term Preterm AB Living  '3 3       3  '$ SAB IAB Ectopic Multiple Live Births          3    # Outcome Date GA Lbr Len/2nd Weight Sex Delivery Anes PTL Lv  3 Para           2 Para           1 Para              The following portions of the patient's history were reviewed and updated as appropriate: allergies, current medications, past family history, past medical history, past social history, past surgical history, and problem list.  Review of Systems Pertinent items noted in HPI and remainder of comprehensive ROS otherwise negative.   Past medical history, past surgical history, family history and social history were all reviewed and documented in the EPIC chart.   Exam:  Vitals:   04/25/22 1326  BP: (!) 140/82   There is no height or weight on file to calculate BMI.  Indications: cervical polyp, post coital bleeding   Vaginal u/s:   Enlarged anteverted Uterus  9.66 x 5.97 x 7.19cm Anterior subserosal fibroid 3.7 x 4.6cm   Small echogenic focal area seen in the posterior aspect of the cervical canal measuring 0.1 x 0.5cm   Tri Layered symmetrical endometrium measuring approx 4.50m   Both ovaries mobile, normal size with normal follicle pattern and normal perfusion. Small resolving corpus luteum on the right ovary   No adnexal masses Small to moderate volume of free fluid in the posterior cul de sac and right adnexa   Impression: fibroid, cervical polyp  Assessment/Plan:   1. Polyp of cervix Polyp  partially remains, will schedule [facility] to further evaluate before proceeding with plan for removal. - [place]oreaSonohysterogram; Future    Diavion Labrador B WHNP-BC 1:57 PM 04/25/2022

## 2022-05-02 NOTE — Telephone Encounter (Signed)
FYI. Colonoscopy scheduled for 05/24/2022.

## 2022-05-23 ENCOUNTER — Encounter: Payer: Self-pay | Admitting: Gastroenterology

## 2022-05-24 ENCOUNTER — Encounter: Payer: Self-pay | Admitting: Gastroenterology

## 2022-05-24 ENCOUNTER — Other Ambulatory Visit: Payer: Self-pay

## 2022-05-24 ENCOUNTER — Ambulatory Visit
Admission: RE | Admit: 2022-05-24 | Discharge: 2022-05-24 | Disposition: A | Discharge status: Home / Self Care | Payer: 59 | Source: Ambulatory Visit | Attending: Gastroenterology | Admitting: Gastroenterology

## 2022-05-24 ENCOUNTER — Encounter
Admission: RE | Disposition: A | Discharge status: Home / Self Care | Payer: Self-pay | Source: Ambulatory Visit | Attending: Gastroenterology

## 2022-05-24 ENCOUNTER — Ambulatory Visit: Payer: 59 | Admitting: General Practice

## 2022-05-24 DIAGNOSIS — D122 Benign neoplasm of ascending colon: Secondary | ICD-10-CM

## 2022-05-24 DIAGNOSIS — D124 Benign neoplasm of descending colon: Secondary | ICD-10-CM | POA: Insufficient documentation

## 2022-05-24 DIAGNOSIS — Z1211 Encounter for screening for malignant neoplasm of colon: Secondary | ICD-10-CM | POA: Insufficient documentation

## 2022-05-24 DIAGNOSIS — Z87891 Personal history of nicotine dependence: Secondary | ICD-10-CM | POA: Diagnosis not present

## 2022-05-24 DIAGNOSIS — R195 Other fecal abnormalities: Secondary | ICD-10-CM | POA: Diagnosis not present

## 2022-05-24 HISTORY — PX: COLONOSCOPY WITH PROPOFOL: SHX5780

## 2022-05-24 LAB — POCT PREGNANCY, URINE: Preg Test, Ur: NEGATIVE

## 2022-05-24 SURGERY — COLONOSCOPY WITH PROPOFOL
Anesthesia: General

## 2022-05-24 MED ORDER — SODIUM CHLORIDE 0.9 % IV SOLN: INTRAVENOUS | Status: DC

## 2022-05-24 MED ORDER — SPOT INK MARKER SYRINGE KIT
PACK | SUBMUCOSAL | Status: DC | PRN
  Administered 2022-05-24: 2 mL via SUBMUCOSAL

## 2022-05-24 MED ORDER — PROPOFOL 1000 MG/100ML IV EMUL
INTRAVENOUS | Status: AC
  Filled 2022-05-24: qty 100

## 2022-05-24 MED ORDER — LIDOCAINE HCL (CARDIAC) PF 100 MG/5ML IV SOSY
PREFILLED_SYRINGE | INTRAVENOUS | Status: DC | PRN
  Administered 2022-05-24: 50 mg via INTRAVENOUS

## 2022-05-24 MED ORDER — PHENYLEPHRINE HCL (PRESSORS) 10 MG/ML IV SOLN
INTRAVENOUS | Status: DC | PRN
  Administered 2022-05-24: 160 ug via INTRAVENOUS
  Administered 2022-05-24: 320 ug via INTRAVENOUS
  Administered 2022-05-24: 160 ug via INTRAVENOUS
  Administered 2022-05-24 (×2): 80 ug via INTRAVENOUS
  Administered 2022-05-24 (×2): 160 ug via INTRAVENOUS

## 2022-05-24 MED ORDER — PROPOFOL 500 MG/50ML IV EMUL
INTRAVENOUS | Status: DC | PRN
  Administered 2022-05-24: 150 ug/kg/min via INTRAVENOUS
  Administered 2022-05-24: 90 mg via INTRAVENOUS

## 2022-05-24 NOTE — H&P (Signed)
Joy Darby, MD [address]  [place]  [place], [place] [address]  Main: [phone]  Fax: [phone] Pager: [phone]  Primary Care Physician:  Hubbard Hartshorn, FNP Primary Gastroenterologist:  Dr. Cephas Lewis  Pre-Procedure History & Physical: HPI:  Joy Lewis is a 46 y.o. female is here for an colonoscopy.   Past Medical History:  Diagnosis Date   Asthma    Foreign body of finger of left hand 07/2014   left middle finger   History of asthma    as a child   History of kidney stones     Past Surgical History:  Procedure Laterality Date   FOREIGN BODY REMOVAL Left    middle finger   FOREIGN BODY REMOVAL Left 08/05/2014   Procedure: REMOVAL FOREIGN BODY LEFT MIDDLE FINGER;  Surgeon: Daryll Brod, MD;  Location: [facility];  Service: Orthopedics;  Laterality: Left;  FOREIGN BODY GIVEN TO PATIENT PER DR. Fredna Dow ORDER.   TUBAL LIGATION      Prior to Admission medications   Medication Sig Start Date End Date Taking? Authorizing Provider  ibuprofen (ADVIL) 600 MG tablet Take 1 tablet (600 mg total) by mouth every 6 (six) hours as needed. 05/27/19   Lilia Pro., MD    Allergies as of 04/19/2022 - Review Complete 04/04/2022  Allergen Reaction Noted   Lenon Ahmadi (malabar nut tree) [justicia adhatoda] Anaphylaxis 07/06/2018   Peanut-containing drug products Hives and Swelling 08/02/2014   Shellfish allergy Hives and Swelling 08/02/2014   Tomato Hives and Swelling 08/02/2014   Penicillins Hives 07/23/2016    Family History  Problem Relation Age of Onset   Hypertension Mother    Diabetes Mother    Stroke Mother    Liver disease Mother    COPD Father    Hypertension Sister    Cervical cancer Maternal Aunt    Endometrial cancer Maternal Grandmother    Cancer Paternal Grandmother        unsure of type    Social History   Socioeconomic History   Marital status: Single    Spouse name: Not on file   Number of children: 3    Years of education: Not on file   Highest education level: Not on file  Occupational History    Employer: [employer]  Tobacco Use   Smoking status: Former    Packs/day: 0.50    Years: 17.00    Total pack years: 8.50    Types: Cigarettes   Smokeless tobacco: Never   Tobacco comments:    Started in 2003  Vaping Use   Vaping Use: Never used  Substance and Sexual Activity   Alcohol use: Not Currently   Drug use: No   Sexual activity: Yes    Partners: Male    Birth control/protection: Surgical    Comment: tubal  Other Topics Concern   Not on file  Social History Narrative   - Lives with her 2 children - ages 105yo Son and 48yo Daughter; has older son who is 6yo   - Dating the same man for about 4 years   - 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   Social Determinants of Health   Financial Resource Strain: Medium Risk (08/22/2018)   Overall Financial Resource Strain (CARDIA)    Difficulty of Paying Living Expenses: Somewhat hard  Food Insecurity: Food Insecurity Present (08/22/2018)   Hunger Vital Sign    Worried About Running Out of Food in the Last Year:  Sometimes true    Ran Out of Food in the Last Year: Sometimes true  Transportation Needs: Unmet Transportation Needs (08/22/2018)   PRAPARE - Hydrologist (Medical): Yes    Lack of Transportation (Non-Medical): Yes  Physical Activity: Inactive (08/22/2018)   Exercise Vital Sign    Days of Exercise per Week: 0 days    Minutes of Exercise per Session: 0 min  Stress: No Stress Concern Present (08/22/2018)   [place]    Feeling of Stress : Only a little  Social Connections: Moderately Isolated (08/22/2018)   Social Connection and Isolation Panel [NHANES]    Frequency of Communication with Friends and Family: More than three times a week    Frequency of Social Gatherings with Friends and Family: More than three times a week     Attends Religious Services: Never    Marine scientist or Organizations: No    Attends Archivist Meetings: Never    Marital Status: Never married  Intimate Partner Violence: Not At Risk (08/22/2018)   Humiliation, Afraid, Rape, and Kick questionnaire    Fear of Current or Ex-Partner: No    Emotionally Abused: No    Physically Abused: No    Sexually Abused: No    Review of Systems: See HPI, otherwise negative ROS  Physical Exam: BP (!) 94/57   Pulse 60   Temp (!) 97.1 F (36.2 C)   Resp 16   Ht '5\' 1"'$  (1.549 m)   Wt 85.3 kg   SpO2 100%   BMI 35.53 kg/m  General:   Alert,  pleasant and cooperative in NAD Head:  Normocephalic and atraumatic. Neck:  Supple; no masses or thyromegaly. Lungs:  Clear throughout to auscultation.    Heart:  Regular rate and rhythm. Abdomen:  Soft, nontender and nondistended. Normal bowel sounds, without guarding, and without rebound.   Neurologic:  Alert and  oriented x4;  grossly normal neurologically.  Impression/Plan: Joy Lewis is here for an colonoscopy to be performed for cologuard positive  Risks, benefits, limitations, and alternatives regarding  colonoscopy have been reviewed with the patient.  Questions have been answered.  All parties agreeable.   Sherri Sear, MD  05/24/2022, 8:19 AM

## 2022-05-24 NOTE — Op Note (Signed)
[facility] Gastroenterology Patient Name: Joy Lewis Procedure Date: 05/24/2022 9:01 AM MRN: [id number] Account #: [id number] Date of Birth: 07/30/1976 Admit Type: Outpatient Age: 46 Room: [facility] ENDO ROOM 1 Gender: Female Note Status: Finalized Instrument Name: Colonscope [id number] Procedure:             Colonoscopy Indications:           This is the patient's first colonoscopy, Positive                         Cologuard test Providers:             Lin Landsman MD, MD Referring MD:          Astrid Divine. Uvaldo Rising (Referring MD) Medicines:             See the Anesthesia note for documentation of the                         administered medications, General Anesthesia Complications:         No immediate complications. Estimated blood loss: None. Procedure:             Pre-Anesthesia Assessment:                        - Prior to the procedure, a History and Physical was                         performed, and patient medications and allergies were                         reviewed. The patient is competent. The risks and                         benefits of the procedure and the sedation options and                         risks were discussed with the patient. All questions                         were answered and informed consent was obtained.                         Patient identification and proposed procedure were                         verified by the physician, the nurse, the                         anesthesiologist, the anesthetist and the technician                         in the pre-procedure area in the procedure room in the                         endoscopy suite. Mental Status Examination: alert and                         oriented. Airway Examination: normal oropharyngeal  airway and neck mobility. Respiratory Examination:                         clear to auscultation. CV Examination: normal.                         Prophylactic  Antibiotics: The patient does not require                         prophylactic antibiotics. Prior Anticoagulants: The                         patient has taken no previous anticoagulant or                         antiplatelet agents. ASA Grade Assessment: II - A                         patient with mild systemic disease. After reviewing                         the risks and benefits, the patient was deemed in                         satisfactory condition to undergo the procedure. The                         anesthesia plan was to use general anesthesia.                         Immediately prior to administration of medications,                         the patient was re-assessed for adequacy to receive                         sedatives. The heart rate, respiratory rate, oxygen                         saturations, blood pressure, adequacy of pulmonary                         ventilation, and response to care were monitored                         throughout the procedure. The physical status of the                         patient was re-assessed after the procedure.                        After obtaining informed consent, the colonoscope was                         passed under direct vision. Throughout the procedure,                         the patient's blood pressure, pulse, and oxygen  saturations were monitored continuously. The                         Colonoscope was introduced through the anus and                         advanced to the the terminal ileum, with                         identification of the appendiceal orifice and IC                         valve. The colonoscopy was performed without                         difficulty. The patient tolerated the procedure well.                         The quality of the bowel preparation was evaluated                         using the BBPS [facility] Bowel Preparation Scale) with                         scores of:  Right Colon = 3, Transverse Colon = 3 and                         Left Colon = 3 (entire mucosa seen well with no                         residual staining, small fragments of stool or opaque                         liquid). The total BBPS score equals 9. Findings:      The perianal and digital rectal examinations were normal. Pertinent       negatives include normal sphincter tone and no palpable rectal lesions.      The terminal ileum appeared normal.      A 40 mm polyp was found in the distal ascending colon. The polyp was       sessile. Preparations were made for mucosal resection. Eleview was       injected with adequate lift of the lesion from the muscularis propria.       Snare mucosal resection with Jabier Mutton net retrieval was performed. A 40 mm       area was resected. Resection and retrieval were complete. There was no       bleeding during and at the end of the procedure. To prevent bleeding       after mucosal resection, three hemostatic clips were successfully       placed. There was no bleeding during, or at the end, of the procedure.       Area was tattooed with an injection of Spot (carbon black). Estimated       blood loss: none.      The retroflexed view of the distal rectum and anal verge was normal and       showed no anal or rectal abnormalities.      The exam was otherwise without  abnormality. Impression:            - The examined portion of the ileum was normal.                        - One 40 mm polyp in the distal ascending colon,                         removed with mucosal resection. Resected and                         retrieved. Clips were placed. Tattooed.                        - The distal rectum and anal verge are normal on                         retroflexion view.                        - The examination was otherwise normal.                        - Mucosal resection was performed. Resection and                         retrieval were  complete. Recommendation:        - Discharge patient to home (with escort).                        - Resume previous diet today.                        - Continue present medications.                        - Await pathology results.                        - Repeat colonoscopy in 1 year for surveillance based                         on pathology results. Procedure Code(s):     --- Professional ---                        [phone], Colonoscopy, flexible; with endoscopic mucosal                         resection Diagnosis Code(s):     --- Professional ---                        K63.5, Polyp of colon                        R19.5, Other fecal abnormalities CPT copyright 2019 American Medical Association. All rights reserved. The codes documented in this report are preliminary and upon coder review may  be revised to meet current compliance requirements. Dr. Ulyess Mort Lin Landsman MD, MD 05/24/2022 9:48:08 AM This report has been signed electronically. Number of Addenda: 0 Note Initiated On: 05/24/2022 9:01 AM Scope  Withdrawal Time: 0 hours 28 minutes 27 seconds  Total Procedure Duration: 0 hours 31 minutes 40 seconds  Estimated Blood Loss:  Estimated blood loss: none.      [facility]

## 2022-05-24 NOTE — Transfer of Care (Signed)
Immediate Anesthesia Transfer of Care Note  Patient: Joy Lewis  Procedure(s) Performed: COLONOSCOPY WITH PROPOFOL  Patient Location: PACU and Endoscopy Unit  Anesthesia Type:General  Level of Consciousness: awake, alert  and oriented  Airway & Oxygen Therapy: Patient Spontanous Breathing  Post-op Assessment: Report given to RN  Post vital signs: Reviewed and stable  Last Vitals:  Vitals Value Taken Time  BP 98/69 05/24/22 0948  Temp    Pulse 82 05/24/22 0948  Resp 21 05/24/22 0948  SpO2 100 % 05/24/22 0948  Vitals shown include unvalidated device data.  Last Pain:  Vitals:   05/24/22 0814  PainSc: 0-No pain         Complications: No notable events documented.

## 2022-05-24 NOTE — Anesthesia Preprocedure Evaluation (Signed)
Anesthesia Evaluation  Patient identified by MRN, date of birth, ID band Patient awake    Reviewed: Allergy & Precautions, NPO status , Patient's Chart, lab work & pertinent test results  History of Anesthesia Complications Negative for: history of anesthetic complications  Airway Mallampati: III  TM Distance: >3 FB Neck ROM: full    Dental  (+) Dental Advidsory Given, Teeth Intact   Pulmonary neg pulmonary ROS, neg shortness of breath, neg recent URI, former smoker,    Pulmonary exam normal        Cardiovascular (-) Past MI and (-) CABG negative cardio ROS Normal cardiovascular exam     Neuro/Psych negative neurological ROS  negative psych ROS   GI/Hepatic negative GI ROS, Neg liver ROS,   Endo/Other  negative endocrine ROS  Renal/GU negative Renal ROS  negative genitourinary   Musculoskeletal   Abdominal   Peds  Hematology negative hematology ROS (+)   Anesthesia Other Findings Past Medical History: No date: Asthma 07/2014: Foreign body of finger of left hand     Comment:  left middle finger No date: History of asthma     Comment:  as a child No date: History of kidney stones  Past Surgical History: No date: FOREIGN BODY REMOVAL; Left     Comment:  middle finger 08/05/2014: FOREIGN BODY REMOVAL; Left     Comment:  Procedure: REMOVAL FOREIGN BODY LEFT MIDDLE FINGER;                Surgeon: Daryll Brod, MD;  Location: [facility];  Service: Orthopedics;  Laterality: Left;                FOREIGN BODY GIVEN TO PATIENT PER DR. Fredna Dow ORDER. No date: TUBAL LIGATION     Reproductive/Obstetrics negative OB ROS                             Anesthesia Physical Anesthesia Plan  ASA: 2  Anesthesia Plan: General   Post-op Pain Management: Minimal or no pain anticipated   Induction: Intravenous  PONV Risk Score and Plan: 3 and Propofol infusion, TIVA and  Ondansetron  Airway Management Planned: Nasal Cannula  Additional Equipment: None  Intra-op Plan:   Post-operative Plan:   Informed Consent: I have reviewed the patients History and Physical, chart, labs and discussed the procedure including the risks, benefits and alternatives for the proposed anesthesia with the patient or authorized representative who has indicated his/her understanding and acceptance.     Dental advisory given  Plan Discussed with: CRNA and Surgeon  Anesthesia Plan Comments: (Discussed risks of anesthesia with patient, including possibility of difficulty with spontaneous ventilation under anesthesia necessitating airway intervention, PONV, and rare risks such as cardiac or respiratory or neurological events, and allergic reactions. Discussed the role of CRNA in patient's perioperative care. Patient understands.)        Anesthesia Quick Evaluation

## 2022-05-25 ENCOUNTER — Encounter: Payer: Self-pay | Admitting: Gastroenterology

## 2022-05-25 LAB — SURGICAL PATHOLOGY

## 2022-05-25 NOTE — Anesthesia Postprocedure Evaluation (Signed)
Anesthesia Post Note  Patient: Joy Lewis  Procedure(s) Performed: COLONOSCOPY WITH PROPOFOL  Patient location during evaluation: Endoscopy Anesthesia Type: General Level of consciousness: awake and alert Pain management: pain level controlled Vital Signs Assessment: post-procedure vital signs reviewed and stable Respiratory status: spontaneous breathing, nonlabored ventilation, respiratory function stable and patient connected to nasal cannula oxygen Cardiovascular status: blood pressure returned to baseline and stable Postop Assessment: no apparent nausea or vomiting Anesthetic complications: no   No notable events documented.   Last Vitals:  Vitals:   05/24/22 0958 05/24/22 1008  BP:    Pulse: 75 (!) 59  Resp:    Temp:    SpO2: 96%     Last Pain:  Vitals:   05/25/22 0729  TempSrc:   PainSc: 0-No pain                 Dimas Millin

## 2022-05-30 ENCOUNTER — Encounter: Payer: Self-pay | Admitting: Gastroenterology

## 2022-05-31 ENCOUNTER — Other Ambulatory Visit: Payer: 59 | Admitting: Obstetrics & Gynecology

## 2022-05-31 ENCOUNTER — Other Ambulatory Visit: Payer: 59
